# Patient Record
Sex: Female | Born: 1945 | Race: White | Hispanic: No | Marital: Single | Smoking: Former smoker
Health system: Southern US, Community
[De-identification: ages and names within clinical notes are randomized; demographics above are authoritative.]

## PROBLEM LIST (undated history)

## (undated) DIAGNOSIS — K219 Gastro-esophageal reflux disease without esophagitis: Secondary | ICD-10-CM

## (undated) DIAGNOSIS — M8580 Other specified disorders of bone density and structure, unspecified site: Secondary | ICD-10-CM

## (undated) DIAGNOSIS — K635 Polyp of colon: Secondary | ICD-10-CM

## (undated) DIAGNOSIS — E785 Hyperlipidemia, unspecified: Secondary | ICD-10-CM

## (undated) DIAGNOSIS — C801 Malignant (primary) neoplasm, unspecified: Secondary | ICD-10-CM

## (undated) DIAGNOSIS — T783XXA Angioneurotic edema, initial encounter: Secondary | ICD-10-CM

## (undated) DIAGNOSIS — G4730 Sleep apnea, unspecified: Secondary | ICD-10-CM

## (undated) DIAGNOSIS — L509 Urticaria, unspecified: Secondary | ICD-10-CM

## (undated) DIAGNOSIS — E039 Hypothyroidism, unspecified: Secondary | ICD-10-CM

## (undated) DIAGNOSIS — K5090 Crohn's disease, unspecified, without complications: Secondary | ICD-10-CM

## (undated) DIAGNOSIS — T7840XA Allergy, unspecified, initial encounter: Secondary | ICD-10-CM

## (undated) DIAGNOSIS — K529 Noninfective gastroenteritis and colitis, unspecified: Secondary | ICD-10-CM

## (undated) HISTORY — DX: Gastro-esophageal reflux disease without esophagitis: K21.9

## (undated) HISTORY — DX: Crohn's disease, unspecified, without complications: K50.90

## (undated) HISTORY — DX: Hyperlipidemia, unspecified: E78.5

## (undated) HISTORY — PX: BREAST BIOPSY: SHX20

## (undated) HISTORY — DX: Urticaria, unspecified: L50.9

## (undated) HISTORY — PX: TUBAL LIGATION: SHX77

## (undated) HISTORY — PX: SKIN CANCER EXCISION: SHX779

## (undated) HISTORY — DX: Hypothyroidism, unspecified: E03.9

## (undated) HISTORY — PX: TONSILLECTOMY: SUR1361

## (undated) HISTORY — DX: Malignant (primary) neoplasm, unspecified: C80.1

## (undated) HISTORY — DX: Noninfective gastroenteritis and colitis, unspecified: K52.9

## (undated) HISTORY — DX: Angioneurotic edema, initial encounter: T78.3XXA

## (undated) HISTORY — DX: Other specified disorders of bone density and structure, unspecified site: M85.80

## (undated) HISTORY — DX: Allergy, unspecified, initial encounter: T78.40XA

## (undated) HISTORY — DX: Polyp of colon: K63.5

## (undated) HISTORY — DX: Sleep apnea, unspecified: G47.30

---

## 1989-07-28 HISTORY — PX: THYROIDECTOMY, PARTIAL: SHX18

## 2016-05-27 ENCOUNTER — Encounter: Payer: Self-pay | Admitting: Gastroenterology

## 2016-07-31 ENCOUNTER — Encounter: Payer: Self-pay | Admitting: Gastroenterology

## 2016-07-31 ENCOUNTER — Encounter (INDEPENDENT_AMBULATORY_CARE_PROVIDER_SITE_OTHER): Payer: Self-pay

## 2016-07-31 ENCOUNTER — Ambulatory Visit (INDEPENDENT_AMBULATORY_CARE_PROVIDER_SITE_OTHER): Payer: Medicare HMO | Admitting: Gastroenterology

## 2016-07-31 ENCOUNTER — Telehealth: Payer: Self-pay | Admitting: Gastroenterology

## 2016-07-31 VITALS — BP 100/64 | HR 60 | Ht 68.0 in | Wt 131.6 lb

## 2016-07-31 DIAGNOSIS — Z1211 Encounter for screening for malignant neoplasm of colon: Secondary | ICD-10-CM

## 2016-07-31 DIAGNOSIS — K219 Gastro-esophageal reflux disease without esophagitis: Secondary | ICD-10-CM | POA: Diagnosis not present

## 2016-07-31 DIAGNOSIS — K649 Unspecified hemorrhoids: Secondary | ICD-10-CM

## 2016-07-31 DIAGNOSIS — K529 Noninfective gastroenteritis and colitis, unspecified: Secondary | ICD-10-CM | POA: Diagnosis not present

## 2016-07-31 MED ORDER — FAMOTIDINE 40 MG PO TABS: 40.0000 mg | ORAL_TABLET | Freq: Two times a day (BID) | ORAL | 3 refills | Status: DC

## 2016-07-31 MED ORDER — NA SULFATE-K SULFATE-MG SULF 17.5-3.13-1.6 GM/177ML PO SOLN: 1.0000 | Freq: Once | ORAL | 0 refills | Status: AC

## 2016-07-31 NOTE — Patient Instructions (Addendum)
If you are age 70 or older, your body mass index should be between 23-30. Your Body mass index is 20.01 kg/m. If this is out of the aforementioned range listed, please consider follow up with your Primary Care Provider.  If you are age 72 or younger, your body mass index should be between 19-25. Your Body mass index is 20.01 kg/m. If this is out of the aformentioned range listed, please consider follow up with your Primary Care Provider.   We have sent the following medications to your pharmacy for you to pick up at your convenience:  [place] have been scheduled for a colonoscopy. Please follow written instructions given to you at your visit today.  Please pick up your prep supplies at the pharmacy within the next 1-3 days. If you use inhalers (even only as needed), please bring them with you on the day of your procedure. Your physician has requested that you go to www.startemmi.com and enter the access code given to you at your visit today. This web site gives a general overview about your procedure. However, you should still follow specific instructions given to you by our office regarding your preparation for the procedure.  Thank you.

## 2016-07-31 NOTE — Telephone Encounter (Signed)
Record release faxed to Dr Virgie Dad.

## 2016-07-31 NOTE — Progress Notes (Signed)
HPI :  71 y/o female with a history of colon polyps, GERD, hypothyroidism, here for a new patient evaluation for diarrhea, hemorrhoids, and colon cancer screening, as well as reflux.  She reports intermittent symptoms of loose stools, has not had any symptoms in 4 weeks. She reports always had a "sensitive" stomach. She reports on a good day she can have one BM per day, sometimes every other day, and sometimes more frequently upwards of 3 loose stools per day. She thinks certain foods can sometimes precipitate symptoms. She denies any blood in the stools;, but has had it in the past. She reports some symptoms of hemorrhoids which bother her. She reports they prolapse, chronically. She does not think they bother her in regards to pain, itching, or bleeding. She has occasional lower abdominal pains. She reports this is relieved with a bowel movement. She has managed this mostly with diet. She thinks she has been tested for celiac disease but is not positive.   She reports last colonoscopy was 10 years ago, she thinks she had some polyps removed. She had an upper endoscopy at that time as well. Procedures performed at a facility in [place], she does not know the name.   She denies much heartburn or reflux which bothers her on her regimen. If she does not take her medications, reflux symptoms bother her more frequently. She denies any dysphagia. No history of Barrett's. No FH of esophageal or colon cancer. She takes omeprazole 80m daily, and pepsid 411mq HS. She has not yet tried pepcid BID.   Of note, a capsule endoscopy was done while at [facility] in  Sept 2013 showing isolated ileojejunitis with erosive changes, started on pentasa at that time. She thinks she took it for a short time and then stopped it. She has never been told she has Crohns disease definitively. She had a negative IBD serology. She does think she has a prior CT scan of the abdomen to assess for Crohns and told it was negative.      Past Medical History:  Diagnosis Date  . Colon polyp   . GERD (gastroesophageal reflux disease)   . Hypothyroidism      Past Surgical History:  Procedure Laterality Date  . ABDOMINAL HYSTERECTOMY    . THYROIDECTOMY, PARTIAL  1991   Family History  Problem Relation Age of Onset  . Diabetes Mother   . Heart disease Mother   . Ovarian cancer Sister   . Kidney disease Sister   . Diabetes Brother   . Heart disease Brother    Social History  Substance Use Topics  . Smoking status: Former SmResearch scientist (life sciences). Smokeless tobacco: Never Used  . Alcohol use No   Current Outpatient Prescriptions  Medication Sig Dispense Refill  . escitalopram (LEXAPRO) 10 MG tablet Take 1 tablet by mouth daily.    . famotidine (PEPCID) 40 MG tablet Take 1 tablet by mouth daily.    . Marland Kitchenevothyroxine (SYNTHROID, LEVOTHROID) 112 MCG tablet Take 1 tablet by mouth daily.    . Marland Kitchenmeprazole (PRILOSEC) 20 MG capsule Take 1 capsule by mouth daily.     No current facility-administered medications for this visit.    No Known Allergies   Review of Systems: All systems reviewed and negative except where noted in HPI.   No labs or imaging in Epic  Physical Exam: BP 100/64   Pulse 60   Ht 5' 8"  (1.727 m)   Wt 131 lb 9.6 oz (59.7 kg)  BMI 20.01 kg/m  Constitutional: Pleasant,well-developed, female in no acute distress. HEENT: Normocephalic and atraumatic. Conjunctivae are normal. No scleral icterus. Neck supple.  Cardiovascular: Normal rate, regular rhythm.  Pulmonary/chest: Effort normal and breath sounds normal. No wheezing, rales or rhonchi. Abdominal: Soft, nondistended, nontender.  There are no masses palpable. No hepatomegaly. Anoscopy / DRE - external skin tags, small to moderate internal hemorrhoids, normal tone, no mass lesions Extremities: no edema Lymphadenopathy: No cervical adenopathy noted. Neurological: Alert and oriented to person place and time. Skin: Skin is warm and dry. No rashes  noted. Psychiatric: Normal mood and affect. Behavior is normal.   ASSESSMENT AND PLAN: 71 year old female here for new patient evaluation for the following issues:  Chronic diarrhea - suspect she may have IBS although I reviewed prior report of her capsule endoscopy which showed changes concerning for possible small bowel Crohn's disease. She apparently had a follow-up workup after this finding for which she was told she does not have Crohn's. I have asked to obtain records from her prior GI physicians to clarify this history. I will also obtain prior lab work ensure she has been ruled out for celiac disease obtain baseline CBC and other labs from her primary care. Otherwise she is due for a screening colonoscopy, and we will schedule her for this. We may plan on taking random biopsies to rule out microscopic colitis. In the interim I counseled her on a low FODMAP diet and she can proceed with this to see if this helps.   Colon cancer screening - as above will await colonoscopy  GERD - symptoms well controlled on regimen as above. I discussed long-term risks of PPIs with her at length. Recommend lowest-dose dose of PPI needed to control symptoms or use of alternative regimen. In this light she wants to try managing this with Pepcid monotherapy which is reasonable. Recommend she try Pepcid twice a day and see how this goes. We'll get results of prior upper endoscopy, feel strongly that she needs endoscopy at this time, no reported history of Barrett's.   Hemorrhoids / skin tags - external skin tags noted on rectal exam is her main complaint, and she does have small to medium sized internal hemorrhoids. I discussed potential banding for her although if her main concern is the external skin tags, banding may not provide much benefit for this. We will await the colonoscopy, pending this result will let her know if I think she is a good candidate for banding or not. She agreed  Gordon Cellar,  MD [facility] Pager [phone]

## 2016-08-19 ENCOUNTER — Telehealth: Payer: Self-pay | Admitting: Gastroenterology

## 2016-08-19 NOTE — Telephone Encounter (Signed)
Records received for this patient from [facility] - Dr. 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 Treated with Pentasa at the time. Also received a course of Enterocort  NO further records after capsule study. 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 Scheduled for colonoscopy in the near future for which she will have re-evaluation, plan on ileal intubation, may need repeat enterography study.   Labs from 10/31 at [place] [facility] show no anemia, Hgb of 12.4

## 2016-09-02 ENCOUNTER — Encounter: Payer: Self-pay | Admitting: Gastroenterology

## 2016-09-16 ENCOUNTER — Encounter: Payer: Self-pay | Admitting: Gastroenterology

## 2016-09-16 ENCOUNTER — Ambulatory Visit (AMBULATORY_SURGERY_CENTER): Payer: Medicare HMO | Admitting: Gastroenterology

## 2016-09-16 VITALS — BP 110/65 | HR 56 | Temp 96.8°F | Resp 15 | Ht 68.0 in | Wt 131.0 lb

## 2016-09-16 DIAGNOSIS — K529 Noninfective gastroenteritis and colitis, unspecified: Secondary | ICD-10-CM

## 2016-09-16 MED ORDER — SODIUM CHLORIDE 0.9 % IV SOLN: 500.0000 mL | INTRAVENOUS | Status: DC

## 2016-09-16 NOTE — Progress Notes (Signed)
Patient c/o eyes feeling irritated upon awakening from anesthesia in recovery room. Right eye is pink. Patient has issues with dry eyes and uses eye drops at home. Anesthesia notified and has been here to assess patient.

## 2016-09-16 NOTE — Op Note (Signed)
[facility] Patient Name: Natalie Carroll Procedure Date: 09/16/2016 8:29 AM MRN: [id number] Endoscopist: Remo Lipps P. Tieisha Darden MD, MD Age: 71 Referring MD:  Date of Birth: 01/11/1946 Gender: Female Account #: [id number] Procedure:                Colonoscopy Indications:              Chronic diarrhea, prior workup raises possibility                            for small bowel Crohn's disease Medicines:                Monitored Anesthesia Care Procedure:                Pre-Anesthesia Assessment:                           - Prior to the procedure, a History and Physical                            was performed, and patient medications and                            allergies were reviewed. The patient's tolerance of                            previous anesthesia was also reviewed. The risks                            and benefits of the procedure and the sedation                            options and risks were discussed with the patient.                            All questions were answered, and informed consent                            was obtained. Prior Anticoagulants: The patient has                            taken no previous anticoagulant or antiplatelet                            agents. ASA Grade Assessment: II - A patient with                            mild systemic disease. After reviewing the risks                            and benefits, the patient was deemed in                            satisfactory condition to undergo the procedure.  After obtaining informed consent, the colonoscope                            was passed under direct vision. Throughout the                            procedure, the patient's blood pressure, pulse, and                            oxygen saturations were monitored continuously. The                            Model PCF-H190DL [id number]) scope was introduced                            through the anus and  advanced to the the terminal                            ileum, with identification of the appendiceal                            orifice and IC valve. The colonoscopy was performed                            without difficulty. The patient tolerated the                            procedure well. The quality of the bowel                            preparation was good. The terminal ileum, ileocecal                            valve, appendiceal orifice, and rectum were                            photographed. Scope In: 8:35:18 AM Scope Out: 8:55:54 AM Scope Withdrawal Time: 0 hours 16 minutes 9 seconds  Total Procedure Duration: 0 hours 20 minutes 36 seconds  Findings:                 The perianal and digital rectal examinations were                            normal.                           The mucosa in the terminal ileum was mildly                            erythematous without focal ulceration or erosion.                            Biopsies were taken with a cold forceps for  histology.                           A few scattered small-mouthed diverticula were                            found in the entire colon.                           Two small angiodysplastic lesions were found in the                            sigmoid colon and in the cecum.                           The colon appeared normal otherwise without                            inflammatory changes. Biopsies for histology were                            taken with a cold forceps from the right colon,                            left colon and transverse colon for evaluation of                            microscopic colitis. No polyps. Complications:            No immediate complications. Estimated blood loss:                            Minimal. Estimated Blood Loss:     Estimated blood loss was minimal. Impression:               - Erythematous mucosa in the terminal ileum.                             Biopsied.                           - Diverticulosis in the entire examined colon.                           - Two colonic angiodysplastic lesions.                           - The entire examined colon is normal. Biopsied to                            rule out microscopic colitis. Recommendation:           - Patient has a contact number available for                            emergencies. The signs and symptoms of potential  delayed complications were discussed with the                            patient. Return to normal activities tomorrow.                            Written discharge instructions were provided to the                            patient.                           - Resume previous diet.                           - Continue present medications.                           - Await pathology results. Remo Lipps P. Mckayla Mulcahey MD, MD 09/16/2016 9:02:38 AM This report has been signed electronically.

## 2016-09-16 NOTE — Progress Notes (Signed)
Called to room to assist during endoscopic procedure.  Patient ID and intended procedure confirmed with present staff. Received instructions for my participation in the procedure from the performing physician.  

## 2016-09-16 NOTE — Patient Instructions (Signed)
YOU HAD AN ENDOSCOPIC PROCEDURE TODAY AT [facility]:   Refer to the procedure report that was given to you for any specific questions about what was found during the examination.  If the procedure report does not answer your questions, please call your gastroenterologist to clarify.  If you requested that your care partner not be given the details of your procedure findings, then the procedure report has been included in a sealed envelope for you to review at your convenience later.  YOU SHOULD EXPECT: Some feelings of bloating in the abdomen. Passage of more gas than usual.  Walking can help get rid of the air that was put into your GI tract during the procedure and reduce the bloating. If you had a lower endoscopy (such as a colonoscopy or flexible sigmoidoscopy) you may notice spotting of blood in your stool or on the toilet paper. If you underwent a bowel prep for your procedure, you may not have a normal bowel movement for a few days.  Please Note:  You might notice some irritation and congestion in your nose or some drainage.  This is from the oxygen used during your procedure.  There is no need for concern and it should clear up in a day or so.  SYMPTOMS TO REPORT IMMEDIATELY:   Following lower endoscopy (colonoscopy or flexible sigmoidoscopy):  Excessive amounts of blood in the stool  Significant tenderness or worsening of abdominal pains  Swelling of the abdomen that is new, acute  Fever of 100F or higher  For urgent or emergent issues, a gastroenterologist can be reached at any hour by calling [phone].   DIET:  We do recommend a small meal at first, but then you may proceed to your regular diet.  Drink plenty of fluids but you should avoid alcoholic beverages for 24 hours.  MEDICATIONS:  Continue present medications.  Please read all hand-outs given to you by your recovery nurse.  ACTIVITY:  You should plan to take it easy for the rest of today and you  should NOT DRIVE or use heavy machinery until tomorrow (because of the sedation medicines used during the test).    FOLLOW UP: Our staff will call the number listed on your records the next business day following your procedure to check on you and address any questions or concerns that you may have regarding the information given to you following your procedure. If we do not reach you, we will leave a message.  However, if you are feeling well and you are not experiencing any problems, there is no need to return our call.  We will assume that you have returned to your regular daily activities without incident.  If any biopsies were taken you will be contacted by phone or by letter within the next 1-3 weeks.  Please call us at [phone] if you have not heard about the biopsies in 3 weeks.   Thank you for allowing [place] to provide for your healthcare needs today.   SIGNATURES/CONFIDENTIALITY: You and/or your care partner have signed paperwork which will be entered into your electronic medical record.  These signatures attest to the fact that that the information above on your After Visit Summary has been reviewed and is understood.  Full responsibility of the confidentiality of this discharge information lies with you and/or your care-partner.

## 2016-09-16 NOTE — Progress Notes (Signed)
Upon awakening fully from anesthesia, pt c/o R eye discomfort, described as "scratchy".  R eye appears slightly red with no obvious injury.  Pt denies blurry vision or pain with EOM movement. Pt reports history of dry eyes, particularly in the morning after sleeping all night. Pt counseled to use OTC eye drops today and tonight before going to bed.  If not resolved in 3 days, pt instructed to call office.

## 2016-09-16 NOTE — Progress Notes (Signed)
Spontaneous respirations throughout. VSS. Resting comfortably. To PACU on room air. Report to  [employer].

## 2016-09-17 ENCOUNTER — Telehealth: Payer: Self-pay

## 2016-09-17 NOTE — Telephone Encounter (Signed)
The reason for this exam was for chronic diarrhea. I am awaiting biopsies that were done for her exam. If she has not tried immodium she can use some of that as needed and I will contact her with the results once available. Thanks

## 2016-09-17 NOTE — Telephone Encounter (Signed)
Spoke to patient she states that after she hung up from talking to endo nurse, she started having diarrhea. She has not tried any imodium, she is drinking ginger ale. Please advise.

## 2016-09-17 NOTE — Telephone Encounter (Signed)
Let patient know to try the imodium as needed for the diarrhea and once the test results come back we will be in touch with her.

## 2016-09-17 NOTE — Telephone Encounter (Signed)
  Follow up Call-  Call back number 09/16/2016  Post procedure Call Back phone  # [phone]  Permission to leave phone message Yes     Patient questions:  Do you have a fever, pain , or abdominal swelling? No. Pain Score  0 *  Have you tolerated food without any problems? Yes.    Have you been able to return to your normal activities? Yes.    Do you have any questions about your discharge instructions: Diet   No. Medications  No. Follow up visit  No.  Do you have questions or concerns about your Care? No.  Actions: * If pain score is 4 or above: No action needed, pain <4.

## 2016-09-19 ENCOUNTER — Other Ambulatory Visit: Payer: Self-pay

## 2016-09-19 MED ORDER — OMEPRAZOLE 20 MG PO CPDR: 20.0000 mg | DELAYED_RELEASE_CAPSULE | Freq: Every day | ORAL | 3 refills | Status: DC

## 2016-12-10 ENCOUNTER — Other Ambulatory Visit: Payer: Self-pay | Admitting: Family Medicine

## 2016-12-10 ENCOUNTER — Ambulatory Visit
Admission: RE | Admit: 2016-12-10 | Discharge: 2016-12-10 | Disposition: A | Discharge status: Home / Self Care | Payer: Medicare HMO | Source: Ambulatory Visit | Attending: Family Medicine | Admitting: Family Medicine

## 2016-12-10 DIAGNOSIS — Z1231 Encounter for screening mammogram for malignant neoplasm of breast: Secondary | ICD-10-CM

## 2017-11-16 ENCOUNTER — Other Ambulatory Visit: Payer: Self-pay | Admitting: Obstetrics and Gynecology

## 2017-11-16 DIAGNOSIS — Z139 Encounter for screening, unspecified: Secondary | ICD-10-CM

## 2017-12-15 ENCOUNTER — Ambulatory Visit
Admission: RE | Admit: 2017-12-15 | Discharge: 2017-12-15 | Disposition: A | Discharge status: Home / Self Care | Payer: Medicare HMO | Source: Ambulatory Visit | Attending: Obstetrics and Gynecology | Admitting: Obstetrics and Gynecology

## 2017-12-15 DIAGNOSIS — Z139 Encounter for screening, unspecified: Secondary | ICD-10-CM

## 2017-12-16 ENCOUNTER — Other Ambulatory Visit: Payer: Self-pay | Admitting: Obstetrics and Gynecology

## 2017-12-16 DIAGNOSIS — R928 Other abnormal and inconclusive findings on diagnostic imaging of breast: Secondary | ICD-10-CM

## 2017-12-18 ENCOUNTER — Ambulatory Visit
Admission: RE | Admit: 2017-12-18 | Discharge: 2017-12-18 | Disposition: A | Discharge status: Home / Self Care | Payer: Medicare HMO | Source: Ambulatory Visit | Attending: Obstetrics and Gynecology | Admitting: Obstetrics and Gynecology

## 2017-12-18 ENCOUNTER — Ambulatory Visit: Payer: Medicare HMO

## 2017-12-18 DIAGNOSIS — R928 Other abnormal and inconclusive findings on diagnostic imaging of breast: Secondary | ICD-10-CM

## 2018-06-08 ENCOUNTER — Telehealth: Payer: Self-pay

## 2018-06-08 NOTE — Telephone Encounter (Signed)
SENT REFERRAL TO SCHEDULING AND FILED NOTES 

## 2018-07-06 ENCOUNTER — Ambulatory Visit: Payer: Medicare HMO | Admitting: Internal Medicine

## 2018-07-15 ENCOUNTER — Other Ambulatory Visit: Payer: Self-pay | Admitting: Obstetrics and Gynecology

## 2018-07-15 DIAGNOSIS — N6489 Other specified disorders of breast: Secondary | ICD-10-CM

## 2018-07-23 ENCOUNTER — Other Ambulatory Visit: Payer: Medicare HMO

## 2018-07-26 ENCOUNTER — Ambulatory Visit
Admission: RE | Admit: 2018-07-26 | Discharge: 2018-07-26 | Disposition: A | Discharge status: Home / Self Care | Payer: Medicare HMO | Source: Ambulatory Visit | Attending: Obstetrics and Gynecology | Admitting: Obstetrics and Gynecology

## 2018-07-26 ENCOUNTER — Other Ambulatory Visit: Payer: Medicare HMO

## 2018-07-26 DIAGNOSIS — N6489 Other specified disorders of breast: Secondary | ICD-10-CM

## 2018-07-27 ENCOUNTER — Other Ambulatory Visit: Payer: Medicare HMO

## 2018-08-05 ENCOUNTER — Ambulatory Visit: Payer: Medicare HMO | Admitting: Cardiology

## 2018-08-26 ENCOUNTER — Ambulatory Visit: Payer: Medicare HMO | Admitting: Cardiology

## 2019-02-01 ENCOUNTER — Other Ambulatory Visit (HOSPITAL_BASED_OUTPATIENT_CLINIC_OR_DEPARTMENT_OTHER): Payer: Self-pay | Admitting: Family Medicine

## 2019-02-01 ENCOUNTER — Other Ambulatory Visit: Payer: Self-pay

## 2019-02-01 ENCOUNTER — Ambulatory Visit (HOSPITAL_BASED_OUTPATIENT_CLINIC_OR_DEPARTMENT_OTHER)
Admission: RE | Admit: 2019-02-01 | Discharge: 2019-02-01 | Disposition: A | Discharge status: Home / Self Care | Payer: Medicare HMO | Source: Ambulatory Visit | Attending: Family Medicine | Admitting: Family Medicine

## 2019-02-01 DIAGNOSIS — M542 Cervicalgia: Secondary | ICD-10-CM | POA: Diagnosis present

## 2019-02-04 ENCOUNTER — Encounter: Payer: Self-pay | Admitting: Physical Therapy

## 2019-02-04 ENCOUNTER — Other Ambulatory Visit: Payer: Self-pay

## 2019-02-04 ENCOUNTER — Ambulatory Visit: Payer: Medicare HMO | Attending: Family Medicine | Admitting: Physical Therapy

## 2019-02-04 DIAGNOSIS — M542 Cervicalgia: Secondary | ICD-10-CM | POA: Diagnosis not present

## 2019-02-04 DIAGNOSIS — M62838 Other muscle spasm: Secondary | ICD-10-CM

## 2019-02-04 NOTE — Therapy (Signed)
[place], [place], [address] Phone: [phone]   Fax:  [phone]  Physical Therapy Evaluation  Patient Details  Name: Natalie Carroll MRN: [id number] Date of Birth: [birth year] Referring Provider (PT): Briscoe   Encounter Date: 02/04/2019  PT End of Session - 02/04/19 0910    Visit Number  1    Date for PT Re-Evaluation  04/07/19    PT Start Time  0488    PT Stop Time  0936    PT Time Calculation (min)  52 min    Activity Tolerance  Patient tolerated treatment well    Behavior During Therapy  Anxious       Past Medical History:  Diagnosis Date  . Colon polyp   . GERD (gastroesophageal reflux disease)   . Hypothyroidism     Past Surgical History:  Procedure Laterality Date  . BREAST BIOPSY     left breast  . THYROIDECTOMY, PARTIAL  1991  . TUBAL LIGATION      There were no vitals filed for this visit.   Subjective Assessment - 02/04/19 0846    Subjective  Patient reports that she has been having pain for about a year.  She is unsure of a cause, she had an x-ray that showed DDD with some osteophytic changes.  Reports that cool air and a fan will cause more pain    Limitations  Reading;House hold activities    Patient Stated Goals  have less pain    Currently in Pain?  Yes    Pain Score  4     Pain Location  Neck    Pain Orientation  Right    Pain Descriptors / Indicators  Aching;Spasm;Tightness    Pain Type  Acute pain    Pain Radiating Towards  some pain into the right upper trap    Pain Onset  More than a month ago    Pain Frequency  Constant    Aggravating Factors   cold air, fan, activity pain up to 7-8/10    Pain Relieving Factors  heat, gentle stretches 4/10    Effect of Pain on Daily Activities  reports just always hurts and is uncomfortable         [facility] PT Assessment - 02/04/19 0001      Assessment   Medical Diagnosis  cervicalgia, DDD    Referring  Provider (PT)  Briscoe    Onset Date/Surgical Date  01/05/19    Hand Dominance  Right    Prior Therapy  no      Precautions   Precautions  None      Balance Screen   Has the patient fallen in the past 6 months  No    Has the patient had a decrease in activity level because of a fear of falling?   No    Is the patient reluctant to leave their home because of a fear of falling?   No      Home Environment   Additional Comments  does housework and yardwork      Prior Function   Level of Independence  Independent    Vocation  Retired    Leisure  does a Oceanographer Comments  fwd shoulders, she reports that she really works on her posture as she wants to slouch      ROM / Strength   AROM /  PROM / Strength  AROM;Strength      AROM   Overall AROM Comments  shoulder ROM WNL's, Cervical ROM WNL's for flexion and extension, rotation decrease 25%, side bending decresaed 50%      Strength   Overall Strength Comments  4/5 some pain in the right upper trap with abduction      Palpation   Palpation comment  She has significant mm spasms in the right upper trap and the neck                Objective measurements completed on examination: See above findings.      [facility] Adult PT Treatment/Exercise - 02/04/19 0001      Modalities   Modalities  Electrical Stimulation;Moist Heat      Moist Heat Therapy   Number Minutes Moist Heat  15 Minutes    Moist Heat Location  Cervical      Electrical Stimulation   Electrical Stimulation Location  right cervical and upper trap area    Electrical Stimulation Action  IFC    Electrical Stimulation Parameters  supine    Electrical Stimulation Goals  Pain             PT Education - 02/04/19 0910    Education Details  HEP for cervcial and scapular retraction, shrugs, upper trap and levator stretches, gave info on home cervical traction    Person(s) Educated  Patient    Methods   Explanation;Demonstration;Tactile cues;Verbal cues;Handout    Comprehension  Verbalized understanding       PT Short Term Goals - 02/04/19 0913      PT SHORT TERM GOAL #1   Title  independent with initial HEP    Time  2    Period  Weeks    Status  New        PT Long Term Goals - 02/04/19 0913      PT LONG TERM GOAL #1   Title  understand posture and body mechanics    Time  8    Period  Weeks    Status  New      PT LONG TERM GOAL #2   Title  decrease pain 50%    Time  8    Period  Weeks    Status  New      PT LONG TERM GOAL #3   Title  increase cervical ROM 25%    Time  8    Period  Weeks    Status  New      PT LONG TERM GOAL #4   Title  report able to write without pain for 45 minutes    Time  8    Period  Weeks    Status  New             Plan - 02/04/19 0911    Clinical Impression Statement  Patient with neck pain for over a year, she is unsure of a cause, x-rays show DDD.  She does report doing a lot of writing.  She has significant spasms in the upper traps and the cervical parapsinals. She reports that she does try to correct her posture often while sitting but reports difficulty and pain.       Patient will benefit from skilled therapeutic intervention in order to improve the following deficits and impairments:  Decreased range of motion, Decreased strength, Increased muscle spasms, Postural dysfunction, Improper body mechanics, Pain  Visit Diagnosis: 1. Cervicalgia   2. Other muscle  spasm        Problem List Patient Active Problem List   Diagnosis Date Noted  . GERD (gastroesophageal reflux disease) 07/31/2016  . Hemorrhoids 07/31/2016    Sumner Boast, PT 02/04/2019, 9:16 AM  [facility] [place], [place], [address] Phone: [phone]   Fax:  [phone]  Name: Natalie Carroll MRN: [id number] Date of Birth: 16-Oct-1945

## 2019-02-11 ENCOUNTER — Other Ambulatory Visit: Payer: Self-pay

## 2019-02-11 ENCOUNTER — Ambulatory Visit: Payer: Medicare HMO | Admitting: Physical Therapy

## 2019-02-11 ENCOUNTER — Encounter: Payer: Self-pay | Admitting: Physical Therapy

## 2019-02-11 DIAGNOSIS — M542 Cervicalgia: Secondary | ICD-10-CM

## 2019-02-11 DIAGNOSIS — M62838 Other muscle spasm: Secondary | ICD-10-CM

## 2019-02-11 NOTE — Therapy (Signed)
[place], [place], [address] Phone: [phone]   Fax:  [phone]  Physical Therapy Treatment  Patient Details  Name: Natalie Carroll MRN: [id number] Date of Birth: September 15, 1945 Referring Provider (PT): Briscoe   Encounter Date: 02/11/2019  PT End of Session - 02/11/19 0904    Visit Number  2    Date for PT Re-Evaluation  04/07/19    PT Start Time  0838    PT Stop Time  0935    PT Time Calculation (min)  57 min    Activity Tolerance  Patient tolerated treatment well    Behavior During Therapy  [facility] for tasks assessed/performed       Past Medical History:  Diagnosis Date  . Colon polyp   . GERD (gastroesophageal reflux disease)   . Hypothyroidism     Past Surgical History:  Procedure Laterality Date  . BREAST BIOPSY     left breast  . THYROIDECTOMY, PARTIAL  1991  . TUBAL LIGATION      There were no vitals filed for this visit.  Subjective Assessment - 02/11/19 0841    Subjective  Patient reports that she is doing well, felt better after the first treatment, she still c/o tightness    Currently in Pain?  Yes    Pain Score  3     Pain Location  Neck    Pain Orientation  Right                       [facility] Adult PT Treatment/Exercise - 02/11/19 0001      Therapeutic Activites    Therapeutic Activities  ADL's    ADL's  reviewed posture and body mechanics, talked about a postural ergonomic reminder and then went over the home cervical traction unit and how it could help      Exercises   Exercises  Neck      Neck Exercises: Theraband   Shoulder Extension  20 reps;Red    Rows  20 reps;Red    Shoulder External Rotation  20 reps;Red      Neck Exercises: Standing   Other Standing Exercises  5# shrugs with upper trap and levator stretches      Modalities   Modalities  Traction      Moist Heat Therapy   Number Minutes Moist Heat  15 Minutes    Moist Heat  Location  Cervical      Electrical Stimulation   Electrical Stimulation Location  right cervical and upper trap area    Electrical Stimulation Action  IFC    Electrical Stimulation Parameters  supine    Electrical Stimulation Goals  Pain      Traction   Type of Traction  Cervical    Min (lbs)  12    Hold Time  static    Time  12             PT Education - 02/11/19 0903    Education Details  gave red tband HEP for scapular rows and extension and ER, info on cervical retraction at home    Person(s) Educated  Patient    Methods  Explanation;Demonstration;Tactile cues;Verbal cues;Handout    Comprehension  Verbalized understanding;Returned demonstration;Verbal cues required;Tactile cues required       PT Short Term Goals - 02/11/19 0907      PT SHORT TERM GOAL #1   Title  independent with initial HEP  Status  Achieved        PT Long Term Goals - 02/04/19 0913      PT LONG TERM GOAL #1   Title  understand posture and body mechanics    Time  8    Period  Weeks    Status  New      PT LONG TERM GOAL #2   Title  decrease pain 50%    Time  8    Period  Weeks    Status  New      PT LONG TERM GOAL #3   Title  increase cervical ROM 25%    Time  8    Period  Weeks    Status  New      PT LONG TERM GOAL #4   Title  report able to write without pain for 45 minutes    Time  8    Period  Weeks    Status  New            Plan - 02/11/19 0905    Clinical Impression Statement  Patient needed a lot of cues with scapular retraction, she would do very well for about 5 reps and then lose it and only did arm motions.  She had a lot of questions about HEP and home traction.  She needs cues about posture as she is on the computer at home    PT Next Visit Plan  see if we can add some gym activities, she does have anxiety about the corona virus    Consulted and Agree with Plan of Care  Patient       Patient will benefit from skilled therapeutic intervention in order to  improve the following deficits and impairments:  Decreased range of motion, Decreased strength, Increased muscle spasms, Postural dysfunction, Improper body mechanics, Pain  Visit Diagnosis: 1. Cervicalgia   2. Other muscle spasm        Problem List Patient Active Problem List   Diagnosis Date Noted  . GERD (gastroesophageal reflux disease) 07/31/2016  . Hemorrhoids 07/31/2016    Sumner Boast., PT 02/11/2019, 9:08 AM  [facility] [place], [place], [address] Phone: [phone]   Fax:  [phone]  Name: Natalie Carroll MRN: [id number] Date of Birth: [birth year]

## 2019-02-17 ENCOUNTER — Encounter: Payer: Self-pay | Admitting: Physical Therapy

## 2019-02-17 ENCOUNTER — Ambulatory Visit: Payer: Medicare HMO | Admitting: Physical Therapy

## 2019-02-17 ENCOUNTER — Other Ambulatory Visit: Payer: Self-pay

## 2019-02-17 DIAGNOSIS — M62838 Other muscle spasm: Secondary | ICD-10-CM

## 2019-02-17 DIAGNOSIS — M542 Cervicalgia: Secondary | ICD-10-CM | POA: Diagnosis not present

## 2019-02-17 NOTE — Therapy (Signed)
[place], [place], [address] Phone: [phone]   Fax:  [phone]  Physical Therapy Treatment  Patient Details  Name: Natalie Carroll MRN: [id number] Date of Birth: 02-03-1946 Referring Provider (PT): Briscoe   Encounter Date: 02/17/2019  PT End of Session - 02/17/19 1051    Visit Number  3    Date for PT Re-Evaluation  04/07/19    PT Start Time  1014    PT Stop Time  1104    PT Time Calculation (min)  50 min    Activity Tolerance  Patient tolerated treatment well    Behavior During Therapy  [facility] for tasks assessed/performed       Past Medical History:  Diagnosis Date  . Colon polyp   . GERD (gastroesophageal reflux disease)   . Hypothyroidism     Past Surgical History:  Procedure Laterality Date  . BREAST BIOPSY     left breast  . THYROIDECTOMY, PARTIAL  1991  . TUBAL LIGATION      There were no vitals filed for this visit.  Subjective Assessment - 02/17/19 1044    Subjective  "this really helps, I am concerned about the copay"    Currently in Pain?  Yes    Pain Score  2     Pain Location  Neck    Pain Relieving Factors  treatment                       [facility] Adult PT Treatment/Exercise - 02/17/19 0001      Neck Exercises: Theraband   Shoulder Extension  20 reps;Green    Rows  20 reps;Green    Shoulder External Rotation  20 reps;Red      Neck Exercises: Standing   Other Standing Exercises  5# shrugs with upper trap and levator stretches      Neck Exercises: Seated   Other Seated Exercise  bent over row 3#, extension and reverse flies 2# 2x10 each      Moist Heat Therapy   Number Minutes Moist Heat  15 Minutes    Moist Heat Location  Cervical      Electrical Stimulation   Electrical Stimulation Location  right cervical and upper trap area    Electrical Stimulation Action  IFC    Electrical Stimulation Parameters  supine    Electrical  Stimulation Goals  Pain      Manual Therapy   Manual Therapy  Soft tissue mobilization;Manual Traction    Soft tissue mobilization  to the right upper trap, cervical area    Manual Traction  occipital release, shoulder depression               PT Short Term Goals - 02/11/19 0907      PT SHORT TERM GOAL #1   Title  independent with initial HEP    Status  Achieved        PT Long Term Goals - 02/17/19 1053      PT LONG TERM GOAL #1   Title  understand posture and body mechanics    Status  Partially Met      PT LONG TERM GOAL #2   Title  decrease pain 50%    Status  Partially Met            Plan - 02/17/19 1052    Clinical Impression Statement  Patient did well with exercises today, still requires cues  for the retractrion.  She is very tight in the upper trap, the levator and the cervical area.  She does have concerns about her copay so she may skip a week    PT Next Visit Plan  may skip a week if needed due to copay    Consulted and Agree with Plan of Care  Patient       Patient will benefit from skilled therapeutic intervention in order to improve the following deficits and impairments:  Decreased range of motion, Decreased strength, Increased muscle spasms, Postural dysfunction, Improper body mechanics, Pain  Visit Diagnosis: 1. Cervicalgia   2. Other muscle spasm        Problem List Patient Active Problem List   Diagnosis Date Noted  . GERD (gastroesophageal reflux disease) 07/31/2016  . Hemorrhoids 07/31/2016    Sumner Boast., PT 02/17/2019, 10:54 AM  [place], [place], [address] Phone: [phone]   Fax:  [phone]  Name: Natalie Carroll MRN: [id number] Date of Birth: Dec 15, 1945

## 2019-02-25 ENCOUNTER — Encounter: Payer: Self-pay | Admitting: Physical Therapy

## 2019-03-02 ENCOUNTER — Encounter: Payer: Self-pay | Admitting: Physical Therapy

## 2019-04-18 ENCOUNTER — Other Ambulatory Visit (INDEPENDENT_AMBULATORY_CARE_PROVIDER_SITE_OTHER): Payer: Medicare HMO

## 2019-04-18 ENCOUNTER — Ambulatory Visit: Payer: Medicare HMO | Admitting: Physician Assistant

## 2019-04-18 ENCOUNTER — Encounter: Payer: Self-pay | Admitting: Physician Assistant

## 2019-04-18 VITALS — BP 96/60 | HR 74 | Temp 98.0°F | Ht 68.0 in | Wt 133.0 lb

## 2019-04-18 DIAGNOSIS — R197 Diarrhea, unspecified: Secondary | ICD-10-CM

## 2019-04-18 LAB — CBC WITH DIFFERENTIAL/PLATELET
Basophils Absolute: 0 10*3/uL (ref 0.0–0.1)
Basophils Relative: 1 % (ref 0.0–3.0)
Eosinophils Absolute: 0.1 10*3/uL (ref 0.0–0.7)
Eosinophils Relative: 3.2 % (ref 0.0–5.0)
HCT: 39 % (ref 36.0–46.0)
Hemoglobin: 12.7 g/dL (ref 12.0–15.0)
Lymphocytes Relative: 18.5 % (ref 12.0–46.0)
Lymphs Abs: 0.8 10*3/uL (ref 0.7–4.0)
MCHC: 32.6 g/dL (ref 30.0–36.0)
MCV: 92.4 fl (ref 78.0–100.0)
Monocytes Absolute: 0.5 10*3/uL (ref 0.1–1.0)
Monocytes Relative: 10.5 % (ref 3.0–12.0)
Neutro Abs: 3 10*3/uL (ref 1.4–7.7)
Neutrophils Relative %: 66.8 % (ref 43.0–77.0)
Platelets: 200 10*3/uL (ref 150.0–400.0)
RBC: 4.22 Mil/uL (ref 3.87–5.11)
RDW: 12.9 % (ref 11.5–15.5)
WBC: 4.5 10*3/uL (ref 4.0–10.5)

## 2019-04-18 LAB — SEDIMENTATION RATE: Sed Rate: 34 mm/hr — ABNORMAL HIGH (ref 0–30)

## 2019-04-18 LAB — C-REACTIVE PROTEIN: CRP: <1 mg/dL (ref 0.5–20.0)

## 2019-04-18 LAB — IGA: IgA: 221 mg/dL (ref 68–378)

## 2019-04-18 NOTE — Progress Notes (Signed)
Agree with assessment and plan as outlined. In this situation, fecal calprotectin could be useful as well if covered by insurance.

## 2019-04-18 NOTE — Patient Instructions (Addendum)
If you are age 72 or older, your body mass index should be between 23-30. Your Body mass index is 20.22 kg/m. If this is out of the aforementioned range listed, please consider follow up with your Primary Care Provider.  If you are age 40 or younger, your body mass index should be between 19-25. Your Body mass index is 20.22 kg/m. If this is out of the aformentioned range listed, please consider follow up with your Primary Care Provider.   Try decreasing Pepcid to 20 mg twice daily if switching from omeprazole. Continue your daily probiotic.  Anti reflux diet given.  Your provider has requested that you go to the basement level for lab work before leaving today. Press "B" on the elevator. The lab is located at the first door on the left as you exit the elevator.  Food Choices for Gastroesophageal Reflux Disease, Adult When you have gastroesophageal reflux disease (GERD), the foods you eat and your eating habits are very important. Choosing the right foods can help ease your discomfort. Think about working with a nutrition specialist (dietitian) to help you make good choices. What are tips for following this plan?  Meals  Choose healthy foods that are low in fat, such as fruits, vegetables, whole grains, low-fat dairy products, and lean meat, fish, and poultry.  Eat small meals often instead of 3 large meals a day. Eat your meals slowly, and in a place where you are relaxed. Avoid bending over or lying down until 2-3 hours after eating.  Avoid eating meals 2-3 hours before bed.  Avoid drinking a lot of liquid with meals.  Cook foods using methods other than frying. Bake, grill, or broil food instead.  Avoid or limit: ? Chocolate. ? Peppermint or spearmint. ? Alcohol. ? Pepper. ? Black and decaffeinated coffee. ? Black and decaffeinated tea. ? Bubbly (carbonated) soft drinks. ? Caffeinated energy drinks and soft drinks.  Limit high-fat foods such as: ? Fatty meat or fried  foods. ? Whole milk, cream, butter, or ice cream. ? Nuts and nut butters. ? Pastries, donuts, and sweets made with butter or shortening.  Avoid foods that cause symptoms. These foods may be different for everyone. Common foods that cause symptoms include: ? Tomatoes. ? Oranges, lemons, and limes. ? Peppers. ? Spicy food. ? Onions and garlic. ? Vinegar. Lifestyle  Maintain a healthy weight. Ask your doctor what weight is healthy for you. If you need to lose weight, work with your doctor to do so safely.  Exercise for at least 30 minutes for 5 or more days each week, or as told by your doctor.  Wear loose-fitting clothes.  Do not smoke. If you need help quitting, ask your doctor.  Sleep with the head of your bed higher than your feet. Use a wedge under the mattress or blocks under the bed frame to raise the head of the bed. Summary  When you have gastroesophageal reflux disease (GERD), food and lifestyle choices are very important in easing your symptoms.  Eat small meals often instead of 3 large meals a day. Eat your meals slowly, and in a place where you are relaxed.  Limit high-fat foods such as fatty meat or fried foods.  Avoid bending over or lying down until 2-3 hours after eating.  Avoid peppermint and spearmint, caffeine, alcohol, and chocolate. This information is not intended to replace advice given to you by your health care provider. Make sure you discuss any questions you have with your health care  provider. Document Released: 01/13/2012 Document Revised: 11/04/2018 Document Reviewed: 08/19/2016 Elsevier Patient Education  2020 [employer].   Follow up with Dr Havery Moros as needed.  Thank you for choosing me and [facility] Amy Trellis Paganini, PA-C

## 2019-04-18 NOTE — Progress Notes (Signed)
Subjective:    Patient ID: Natalie Carroll, female    DOB: July 23, 1946, 73 y.o.   MRN: [id number]  HPI Natalie "Jeanett Schlein" is a pleasant 73 year old white female, established with Dr. Havery Moros who comes in today with complaints of intermittent diarrhea. She has history of chronic GERD, colon polyps, colonic AVMs, hypothyroidism. She was last seen in the office in January 2018.  She was started on a low FODMAP diet at that time and then underwent colonoscopy in February 2018 with finding of 2 small AVMs, the terminal ileum was noted to be mildly erythematous but without ulcers and colon was normal.  Biopsies were done to rule out microscopic colitis and these were negative.  There was some focal active inflammation on ileal biopsies, nonspecific.  Question of Crohn's was raised.  Capsule endoscopy was suggested but she decided not to follow through with that at that time.  She also had prior records from [facility] reviewed by Dr. Havery Moros likewise with previous questionable Crohn's.  She did have IBD serology done which was negative. Patient says since that time she has had some diarrhea intermittently.  She says it tends to come and go at times she will have some burning discomfort in the left lower quadrant.  More recently she had been having some episodes over the past couple of months with urgent diarrhea that was waking her up at about 3 AM, sometimes incontinent.  She would have one episode of diarrhea and then feel fine.  This was occurring every 1 to 2 weeks.  She started herself on a probiotic which she showed me today which is a multi-species product.  She says since she started that she has not had any further episodes of diarrhea and that was about 3 weeks ago.  Occasionally she will have some sense of urgency during the day but has generally been having normal bowel movements.  No melena or hematochezia.  No ongoing abdominal pain. She also has history of chronic GERD and  had questions about meds today.  Specifically she is asking about omeprazole and whether it is safe.  She has been on 20 mg daily long-term.  She also has a prescription for Pepcid 40 mg but says when she has tried Pepcid in the past this resulted in diarrhea and so she has avoided it.  Review of Systems Pertinent positive and negative review of systems were noted in the above HPI section.  All other review of systems was otherwise negative.  Outpatient Encounter Medications as of 04/18/2019  Medication Sig  . calcium-vitamin D 250-100 MG-UNIT tablet Take 1 tablet by mouth 2 (two) times daily.  . cholecalciferol (VITAMIN D3) 25 MCG (1000 UT) tablet Take 1,000 Units by mouth daily.  Marland Kitchen escitalopram (LEXAPRO) 10 MG tablet Take 1 tablet by mouth daily.  . famotidine (PEPCID) 40 MG tablet Take 1 tablet (40 mg total) by mouth 2 (two) times daily.  Marland Kitchen levothyroxine (SYNTHROID, LEVOTHROID) 112 MCG tablet Take 1 tablet by mouth daily.  . Magnesium Oxide 250 MG TABS Take by mouth daily.  . Misc Natural Products (JOINT HEALTH PO) Take by mouth. zyflamed by new chapter once daily  . Multiple Vitamin (MULTIVITAMIN) tablet Take 1 tablet by mouth daily.  Marland Kitchen omeprazole (PRILOSEC) 20 MG capsule Take 1 capsule (20 mg total) by mouth daily.  . pravastatin (PRAVACHOL) 20 MG tablet Take 20 mg by mouth daily.  . Probiotic Product (PROBIOTIC DAILY PO) Take by mouth.  . TURMERIC PO Take  720 mg by mouth daily.  . vitamin B-12 (CYANOCOBALAMIN) 1000 MCG tablet Take 1,000 mcg by mouth daily.  . vitamin C (ASCORBIC ACID) 500 MG tablet Take 500 mg by mouth daily.   Facility-Administered Encounter Medications as of 04/18/2019  Medication  . 0.9 %  sodium chloride infusion   No Known Allergies Patient Active Problem List   Diagnosis Date Noted  . GERD (gastroesophageal reflux disease) 07/31/2016  . Hemorrhoids 07/31/2016   Social History   Socioeconomic History  . Marital status: Single    Spouse name: Not on file   . Number of children: 1  . Years of education: Not on file  . Highest education level: Not on file  Occupational History  . Occupation: Retired  Scientific laboratory technician  . Financial resource strain: Not on file  . Food insecurity    Worry: Not on file    Inability: Not on file  . Transportation needs    Medical: Not on file    Non-medical: Not on file  Tobacco Use  . Smoking status: Former Research scientist (life sciences)  . Smokeless tobacco: Never Used  Substance and Sexual Activity  . Alcohol use: No  . Drug use: No  . Sexual activity: Not on file  Lifestyle  . Physical activity    Days per week: Not on file    Minutes per session: Not on file  . Stress: Not on file  Relationships  . Social Herbalist on phone: Not on file    Gets together: Not on file    Attends religious service: Not on file    Active member of club or organization: Not on file    Attends meetings of clubs or organizations: Not on file    Relationship status: Not on file  . Intimate partner violence    Fear of current or ex partner: Not on file    Emotionally abused: Not on file    Physically abused: Not on file    Forced sexual activity: Not on file  Other Topics Concern  . Not on file  Social History Narrative  . Not on file    Ms. Lofland's family history includes Diabetes in her brother and mother; Heart disease in her brother and mother; Kidney disease in her sister; Ovarian cancer in her sister.      Objective:    Vitals:   04/18/19 1009  BP: 96/60  Pulse: 74  Temp: 98 F (36.7 C)    Physical Exam Well-developed well-nourished white  female in no acute distress.  Height, VQQVZD,638 BMI 20.2  HEENT; nontraumatic normocephalic, EOMI, PER R LA, sclera anicteric. Oropharynx;mask/Covid Neck; supple, no JVD Cardiovascular; regular rate and rhythm with S1-S2, no murmur rub or gallop Pulmonary; Clear bilaterally Abdomen; soft, nontender, nondistended, no palpable mass or hepatosplenomegaly, bowel sounds are  active Rectal; not done Skin; benign exam, no jaundice rash or appreciable lesions Extremities; no clubbing cyanosis or edema skin warm and dry Neuro/Psych; alert and oriented x4, grossly nonfocal mood and affect appropriate       Assessment & Plan:   #39 73 year old white female with previous work-up for diarrhea in 2018 showing some nonspecific inflammation of the TI on biopsies, colonic biopsies negative for microscopic colitis.  Question of mild Crohn's entertained but patient has never been treated. Capsule endoscopy and trial of budesonide had been suggested, but patient has not been seen over the past couple of years. Patient comes back in today with complaints of intermittent episodes of  diarrhea over the past couple of months generally occurring nocturnally and sometimes with incontinence. She says prior to that over the past couple of years she would just have a sporadic episode of diarrhea but nothing on a regular basis.  Symptoms now improved over the past 3 weeks with addition of probiotic.  Etiology not clear, current symptoms more consistent with an IBS type picture with very intermittent nature.  We will also rule out celiac disease  #2 chronic GERD-on chronic low-dose PPI with good control #3 small colonic AVMs  Plan; continue daily probiotic, multi-species product. Sed rate, CRP, TTG and IgA. Patient was given a copy of an antireflux diet and antireflux regimen to try to tighten up both. If she wants to try to come off of omeprazole she will try taking 1/2 tablet of 40 mg Pepcid twice daily, which hopefully will have less tendency to cause diarrhea. Patient will follow-up with Dr. Havery Moros or myself.   S  PA-C 04/18/2019   Cc: Katherina Mires, MD

## 2019-04-19 ENCOUNTER — Emergency Department (HOSPITAL_BASED_OUTPATIENT_CLINIC_OR_DEPARTMENT_OTHER)
Admission: EM | Admit: 2019-04-19 | Discharge: 2019-04-19 | Disposition: A | Discharge status: Home / Self Care | Payer: Medicare HMO | Attending: Emergency Medicine | Admitting: Emergency Medicine

## 2019-04-19 ENCOUNTER — Encounter (HOSPITAL_BASED_OUTPATIENT_CLINIC_OR_DEPARTMENT_OTHER): Payer: Self-pay | Admitting: *Deleted

## 2019-04-19 ENCOUNTER — Other Ambulatory Visit: Payer: Self-pay

## 2019-04-19 ENCOUNTER — Emergency Department (HOSPITAL_BASED_OUTPATIENT_CLINIC_OR_DEPARTMENT_OTHER): Payer: Medicare HMO

## 2019-04-19 DIAGNOSIS — R079 Chest pain, unspecified: Secondary | ICD-10-CM | POA: Diagnosis present

## 2019-04-19 DIAGNOSIS — E039 Hypothyroidism, unspecified: Secondary | ICD-10-CM | POA: Insufficient documentation

## 2019-04-19 DIAGNOSIS — Z79899 Other long term (current) drug therapy: Secondary | ICD-10-CM | POA: Diagnosis not present

## 2019-04-19 DIAGNOSIS — R0789 Other chest pain: Secondary | ICD-10-CM

## 2019-04-19 DIAGNOSIS — Z87891 Personal history of nicotine dependence: Secondary | ICD-10-CM | POA: Diagnosis not present

## 2019-04-19 LAB — TISSUE TRANSGLUTAMINASE, IGA: (tTG) Ab, IgA: 1 U/mL

## 2019-04-19 LAB — CBC
HCT: 39.8 % (ref 36.0–46.0)
Hemoglobin: 12.7 g/dL (ref 12.0–15.0)
MCH: 30 pg (ref 26.0–34.0)
MCHC: 31.9 g/dL (ref 30.0–36.0)
MCV: 94.1 fL (ref 80.0–100.0)
Platelets: 200 10*3/uL (ref 150–400)
RBC: 4.23 MIL/uL (ref 3.87–5.11)
RDW: 12.6 % (ref 11.5–15.5)
WBC: 4.7 10*3/uL (ref 4.0–10.5)
nRBC: 0 % (ref 0.0–0.2)

## 2019-04-19 LAB — TROPONIN I (HIGH SENSITIVITY)
Troponin I (High Sensitivity): <2 ng/L (ref <18)
Troponin I (High Sensitivity): <2 ng/L (ref <18)

## 2019-04-19 LAB — BASIC METABOLIC PANEL
Anion gap: 11 (ref 5–15)
BUN: 22 mg/dL (ref 8–23)
CO2: 28 mmol/L (ref 22–32)
Calcium: 10.1 mg/dL (ref 8.9–10.3)
Chloride: 100 mmol/L (ref 98–111)
Creatinine, Ser: 0.85 mg/dL (ref 0.44–1.00)
GFR calc Af Amer: >60 mL/min (ref >60)
GFR calc non Af Amer: >60 mL/min (ref >60)
Glucose, Bld: 103 mg/dL — ABNORMAL HIGH (ref 70–99)
Potassium: 3.7 mmol/L (ref 3.5–5.1)
Sodium: 139 mmol/L (ref 135–145)

## 2019-04-19 MED ORDER — SODIUM CHLORIDE 0.9% FLUSH
3.0000 mL | Freq: Once | INTRAVENOUS | Status: AC
  Administered 2019-04-19: 3 mL via INTRAVENOUS
  Filled 2019-04-19: qty 3

## 2019-04-19 NOTE — Discharge Instructions (Addendum)
Return as we discussed for any new or worse chest pain or chest pain lasting 15 minutes or longer.  They can appointment to follow-up with cardiology.  Today's work-up without any acute findings.  Recommend that she start a baby aspirin a day.

## 2019-04-19 NOTE — ED Triage Notes (Signed)
Pressure in her chest x 2 days. She was seen by her MD today and had EKG changes. She was told to come here for further evaluation.

## 2019-04-19 NOTE — ED Provider Notes (Signed)
[facility] EMERGENCY DEPARTMENT Provider Note   CSN: [id number] Arrival date & time: 04/19/19  1407     History   Chief Complaint Chief Complaint  Patient presents with  . Chest Pain    HPI Natalie Carroll is a 73 y.o. female.     Patient sent in from primary care's office for EKG changes.  Patient is also had some intermittent chest pain today lasting only seconds kind of sharp in nature.  Has been going on for a few days.  Originally it was more left-sided chest now it is kind of down in the lower sternum area.  No abdominal pain.  No shortness of breath no nausea or vomiting.  The symptoms are new for her.  Nothing is lasted 15 or 20 minutes.  No known coronary artery disease no known history of diabetes known history no history of hypertension.  Patient's had a total of about 3-4 bouts of this today.  No symptoms currently     Past Medical History:  Diagnosis Date  . Colon polyp   . GERD (gastroesophageal reflux disease)   . Hypothyroidism     Patient Active Problem List   Diagnosis Date Noted  . GERD (gastroesophageal reflux disease) 07/31/2016  . Hemorrhoids 07/31/2016    Past Surgical History:  Procedure Laterality Date  . BREAST BIOPSY     left breast  . THYROIDECTOMY, PARTIAL  1991  . TUBAL LIGATION       OB History   No obstetric history on file.      Home Medications    Prior to Admission medications   Medication Sig Start Date End Date Taking? Authorizing Provider  calcium-vitamin D 250-100 MG-UNIT tablet Take 1 tablet by mouth 2 (two) times daily.    [provider]  cholecalciferol (VITAMIN D3) 25 MCG (1000 UT) tablet Take 1,000 Units by mouth daily.    [provider]  escitalopram (LEXAPRO) 10 MG tablet Take 1 tablet by mouth daily. 06/06/16   [provider]  famotidine (PEPCID) 40 MG tablet Take 1 tablet (40 mg total) by mouth 2 (two) times daily. 07/31/16   Armbruster, Carlota Raspberry, MD  levothyroxine  (SYNTHROID, LEVOTHROID) 112 MCG tablet Take 1 tablet by mouth daily.    [provider]  Magnesium Oxide 250 MG TABS Take by mouth daily.    [provider]  Misc Natural Products (JOINT HEALTH PO) Take by mouth. zyflamed by new chapter once daily    [provider]  Multiple Vitamin (MULTIVITAMIN) tablet Take 1 tablet by mouth daily.    [provider]  omeprazole (PRILOSEC) 20 MG capsule Take 1 capsule (20 mg total) by mouth daily. 09/19/16   Armbruster, Carlota Raspberry, MD  pravastatin (PRAVACHOL) 20 MG tablet Take 20 mg by mouth daily.    [provider]  Probiotic Product (PROBIOTIC DAILY PO) Take by mouth.    [provider]  TURMERIC PO Take 720 mg by mouth daily.    [provider]  vitamin B-12 (CYANOCOBALAMIN) 1000 MCG tablet Take 1,000 mcg by mouth daily.    [provider]  vitamin C (ASCORBIC ACID) 500 MG tablet Take 500 mg by mouth daily.    [provider]    Family History Family History  Problem Relation Age of Onset  . Diabetes Mother   . Heart disease Mother   . Ovarian cancer Sister   . Kidney disease Sister   . Diabetes Brother   .  Heart disease Brother     Social History Social History   Tobacco Use  . Smoking status: Former Research scientist (life sciences)  . Smokeless tobacco: Never Used  Substance Use Topics  . Alcohol use: No  . Drug use: No     Allergies   Patient has no known allergies.   Review of Systems Review of Systems  Constitutional: Negative for chills and fever.  HENT: Negative for congestion, rhinorrhea and sore throat.   Eyes: Negative for visual disturbance.  Respiratory: Negative for cough and shortness of breath.   Cardiovascular: Positive for chest pain. Negative for leg swelling.  Gastrointestinal: Negative for abdominal pain, diarrhea, nausea and vomiting.  Genitourinary: Negative for dysuria.  Musculoskeletal: Negative for back pain and neck pain.  Skin: Negative for rash.   Neurological: Negative for dizziness, light-headedness and headaches.  Hematological: Does not bruise/bleed easily.  Psychiatric/Behavioral: Negative for confusion.     Physical Exam Updated Vital Signs BP 124/65   Pulse 62   Temp 99 F (37.2 C)   Resp (!) 21   Ht 1.727 m (5' 8" )   Wt 60.3 kg   SpO2 99%   BMI 20.22 kg/m   Physical Exam Vitals signs and nursing note reviewed.  Constitutional:      General: She is not in acute distress.    Appearance: She is well-developed.  HENT:     Head: Normocephalic and atraumatic.  Eyes:     Extraocular Movements: Extraocular movements intact.     Conjunctiva/sclera: Conjunctivae normal.     Pupils: Pupils are equal, round, and reactive to light.  Neck:     Musculoskeletal: Normal range of motion and neck supple.  Cardiovascular:     Rate and Rhythm: Normal rate and regular rhythm.     Heart sounds: No murmur.  Pulmonary:     Effort: Pulmonary effort is normal. No respiratory distress.     Breath sounds: Normal breath sounds.  Abdominal:     Palpations: Abdomen is soft.     Tenderness: There is no abdominal tenderness.  Musculoskeletal: Normal range of motion.        General: No swelling.  Skin:    General: Skin is warm and dry.  Neurological:     General: No focal deficit present.     Mental Status: She is alert and oriented to person, place, and time.      ED Treatments / Results  Labs (all labs ordered are listed, but only abnormal results are displayed) Labs Reviewed  BASIC METABOLIC PANEL - Abnormal; Notable for the following components:      Result Value   Glucose, Bld 103 (*)    All other components within normal limits  CBC  TROPONIN I (HIGH SENSITIVITY)  TROPONIN I (HIGH SENSITIVITY)    EKG EKG Interpretation  Date/Time:  Tuesday April 19 2019 15:27:16 EDT Ventricular Rate:  64 PR Interval:  186 QRS Duration: 142 QT Interval:  453 QTC Calculation: 468 R Axis:   68 Text Interpretation:  Sinus  rhythm Right bundle branch block Limb reversal corrected Confirmed by Fredia Sorrow [phone]) on 04/19/2019 3:30:52 PM   Radiology Dg Chest 2 View  Result Date: 04/19/2019 CLINICAL DATA:  Chest pain, sudden onset 3 days ago EXAM: CHEST - 2 VIEW COMPARISON:  None. FINDINGS: The heart size and mediastinal contours are within normal limits. Both lungs are clear. The visualized skeletal structures are unremarkable. IMPRESSION: No active cardiopulmonary disease. Electronically Signed   By: Jewel Baize.D.  On: 04/19/2019 15:04    Procedures Procedures (including critical care time)  Medications Ordered in ED Medications  sodium chloride flush (NS) 0.9 % injection 3 mL (3 mLs Intravenous Given 04/19/19 1453)     Initial Impression / Assessment and Plan / ED Course  I have reviewed the triage vital signs and the nursing notes.  Pertinent labs & imaging results that were available during my care of the patient were reviewed by me and considered in my medical decision making (see chart for details).        By history chest pain seemed very atypical.  Was only kind of sharp bouts of pain almost like pinpricks lasting only seconds.  Is occurred several times today.  Also has occurred over the last few days initially was left-sided chest now more lower substernal area.  Sent in by primary care doctor for concerns for abnormal EKG.  EKG done here is consistent with right bundle branch block.  Which is new compared to EKGs from several years ago.  Patient's chest x-ray negative not hypoxic no shortness of breath not tachycardic.  Labs and troponin without any significant findings.  Patient start a baby aspirin a day will have her follow-up with cardiology.  No evidence of an acute cardiac event.  Final Clinical Impressions(s) / ED Diagnoses   Final diagnoses:  Atypical chest pain    ED Discharge Orders    None       Fredia Sorrow, MD 04/19/19 1730

## 2019-04-20 ENCOUNTER — Telehealth: Payer: Self-pay | Admitting: Physician Assistant

## 2019-04-20 NOTE — Telephone Encounter (Signed)
Pt called about lab results.

## 2019-04-21 ENCOUNTER — Other Ambulatory Visit: Payer: Self-pay

## 2019-04-21 DIAGNOSIS — R197 Diarrhea, unspecified: Secondary | ICD-10-CM

## 2019-04-21 NOTE — Telephone Encounter (Signed)
Advised. See labs for instruction.

## 2019-04-26 ENCOUNTER — Other Ambulatory Visit: Payer: Medicare HMO

## 2019-04-26 DIAGNOSIS — R197 Diarrhea, unspecified: Secondary | ICD-10-CM

## 2019-04-29 ENCOUNTER — Telehealth: Payer: Self-pay | Admitting: Physician Assistant

## 2019-04-29 NOTE — Telephone Encounter (Signed)
Pt inquired about results of stool test.

## 2019-05-03 ENCOUNTER — Telehealth: Payer: Self-pay | Admitting: Physician Assistant

## 2019-05-03 NOTE — Telephone Encounter (Signed)
Pt called asking about stool test results.

## 2019-05-03 NOTE — Telephone Encounter (Signed)
Advised the patient the test will take another 24 to 48 hours to complete.  I have confirmed this with [facility].

## 2019-05-05 LAB — CALPROTECTIN, FECAL: Calprotectin, Fecal: 413 ug/g — ABNORMAL HIGH (ref 0–120)

## 2019-05-10 NOTE — Telephone Encounter (Signed)
Pt called again inquiring about stool test results.

## 2019-05-11 NOTE — Telephone Encounter (Signed)
Please look at the results from fecal test. I think you are trying to r/o Crohn's. Thanks

## 2019-05-11 NOTE — Telephone Encounter (Signed)
Thanks Amy I think a trial of budesonide is reasonable, would try budesonide 91m / day for 4 weeks, then decrease to 682m/ day for 2 weeks, then 53m48m day for 2 weeks, then stop. I think cross sectional imaging would be useful hear to further evaluate for Crohn's if he has not had it yet, would order MR enterography. Thanks

## 2019-05-11 NOTE — Telephone Encounter (Signed)
Natalie Carroll - this is your pt - please review my recent note - has been ? Of mild Crohns- sed rate was elevated , did fecal calprotectin which is elevated - having frequent bouts of diarrhea- want to try course of budesonide ?

## 2019-05-12 ENCOUNTER — Other Ambulatory Visit: Payer: Self-pay

## 2019-05-12 MED ORDER — BUDESONIDE 3 MG PO CPEP: ORAL_CAPSULE | ORAL | 0 refills | Status: DC

## 2019-05-12 NOTE — Telephone Encounter (Signed)
Discussed in detail with the patient on the telephone. Per her request, I have also sent her a patient message with the same information.

## 2019-05-12 NOTE — Telephone Encounter (Signed)
Natalie Carroll, please call pt - let her know the fecal calprotectin is elevated raising suspicion for IBD? Crohns as cause for diarrhea Discussed with Dr.Armbruster - we would like to give her a course of Budesonide 3 mg tabs- /Entocort..  3 tabs daily x 2 weeks, then 2 tabs daily x 2 weeks then One tab daily  X 2-4 weeks - lets make her a follow up with Dr Havery Moros in 6 weeks

## 2019-05-16 ENCOUNTER — Telehealth: Payer: Self-pay | Admitting: Physician Assistant

## 2019-05-19 NOTE — Telephone Encounter (Signed)
PA form for budesonide completed and faxed back to [facility].

## 2019-06-10 NOTE — Telephone Encounter (Signed)
[name] , I am giving this back to you to pursue with the provider. It was denied. It can be appealed if that is done before 60 days has past. Sorry I was unsuccessful. Unfortunately insurance has the power to dictate health care. The papers are in the In Box for [name]. I have highlighted the relevant area that explains what the basis for denying Budesonide is.

## 2019-06-10 NOTE — Telephone Encounter (Signed)
Spoke with patient today. She states she has the medication and has been taking it.  That she will finish her dose and then call back if she needs to be seen.

## 2019-06-13 NOTE — Telephone Encounter (Signed)
Beth - please make sure pt has a follow up office visit scheduled with Dr Havery Moros

## 2019-06-13 NOTE — Telephone Encounter (Signed)
Pt already has an OV scheduled with Dr. Havery Moros for 06/21/19@1 :20pm.

## 2019-06-21 ENCOUNTER — Ambulatory Visit: Payer: Medicare HMO | Admitting: Gastroenterology

## 2019-06-21 ENCOUNTER — Other Ambulatory Visit: Payer: Self-pay

## 2019-06-21 ENCOUNTER — Encounter: Payer: Self-pay | Admitting: Gastroenterology

## 2019-06-21 VITALS — BP 116/64 | HR 68 | Temp 98.2°F | Ht 68.0 in | Wt 135.0 lb

## 2019-06-21 DIAGNOSIS — K5000 Crohn's disease of small intestine without complications: Secondary | ICD-10-CM | POA: Diagnosis not present

## 2019-06-21 DIAGNOSIS — K529 Noninfective gastroenteritis and colitis, unspecified: Secondary | ICD-10-CM

## 2019-06-21 DIAGNOSIS — K219 Gastro-esophageal reflux disease without esophagitis: Secondary | ICD-10-CM

## 2019-06-21 MED ORDER — OMEPRAZOLE 20 MG PO CPDR: 20.0000 mg | DELAYED_RELEASE_CAPSULE | Freq: Every day | ORAL | 3 refills | Status: DC

## 2019-06-21 MED ORDER — GAVISCON EXTRA RELIEF FORMULA 508-475 MG/10ML PO SUSP: ORAL | 0 refills | Status: DC

## 2019-06-21 MED ORDER — OMEPRAZOLE 20 MG PO CPDR: DELAYED_RELEASE_CAPSULE | ORAL | 1 refills | Status: DC

## 2019-06-21 NOTE — Progress Notes (Signed)
HPI :  73 year old female here for a follow-up visit.  She has a history of chronic reflux and suspected Crohn's disease based off work-up as below, listed in chronological order.  Initial workup done in [facility] - Dr. 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 Treated with Pentasa at the time. Also received a course of Enterocort  Colonoscopy in February 2018 with me, 2 small AVMs, the terminal ileum was noted to be mildly erythematous but without ulcers and colon was normal.  Biopsies were done to rule out microscopic colitis and these were negative.  There was some focal active inflammation on ileal biopsies, nonspecific.  Question of Crohn's was again raised.  Capsule endoscopy was suggested but she decided not to follow through with that at that time.    She had some recurrent symptoms of persistent diarrhea in recent months when she presented for follow-up visit.  Labs obtained as below:  ESR 34 Fecal calprotectin 413  She was given empiric course of budesonide 9 mg a day for 2 weeks followed by 6 mg a day for 2 weeks followed by 3 mg a day for 2 weeks tapering.  She states she clearly had a benefit of her loose stools when she started taking this.  She went from 3 urgent loose stools today to 1 bowel movement that was formed per day without blood.  She has not had any abdominal pains since she initially went on the regimen.  Generally she states she is doing pretty good at this time.  She is concerned perhaps her stool is loosening up a little bit as she is recently tapered down to 3 mg for the day.  She is a bit concerned  about the cost of budesonide, it was not covered by her insurance yet she did not want to take prednisone.  We discussed options as below  Otherwise she is concerned about her long-term use of omeprazole for her reflux.  She has throat irritation with coughing and belching that frequently bothers her.  She denies much pyrosis.  She has been on omeprazole 20 mg a day for this, indefinitely reduces her symptoms and helps her feel better although does not completely resolve it.  She tried famotidine in the past which she did not find this helpful.  No dysphagia.  She is concerned about long-term risks and asked about other options.   Past Medical History:  Diagnosis Date   Colon polyp    GERD (gastroesophageal reflux disease)    Hypothyroidism    Ileitis      Past Surgical History:  Procedure Laterality Date   BREAST BIOPSY     left breast   THYROIDECTOMY, PARTIAL  1991   TUBAL LIGATION     Family History  Problem Relation Age of Onset   Diabetes Mother    Heart disease Mother    Ovarian cancer Sister    Kidney disease Sister    Diabetes Brother    Heart disease Brother    Social History   Tobacco Use   Smoking status: Former Smoker   Smokeless tobacco: Never Used  Substance Use Topics   Alcohol use: No  Drug use: No   Current Outpatient Medications  Medication Sig Dispense Refill   budesonide (ENTOCORT EC) 3 MG 24 hr capsule Take 3 capsules (9 mg total) by mouth daily for 14 days, THEN 2 capsules (6 mg total) daily for 14 days, THEN 1 capsule (3 mg total) daily. 100 capsule 0   calcium-vitamin D 250-100 MG-UNIT tablet Take 1 tablet by mouth 2 (two) times daily.     cholecalciferol (VITAMIN D3) 25 MCG (1000 UT) tablet Take 1,000 Units by mouth daily.     escitalopram (LEXAPRO) 10 MG tablet Take 1 tablet by mouth daily.     famotidine (PEPCID) 40 MG tablet Take 1 tablet (40 mg total) by mouth 2 (two) times daily. 90 tablet 3   levothyroxine  (SYNTHROID, LEVOTHROID) 112 MCG tablet Take 1 tablet by mouth daily.     Magnesium Oxide 250 MG TABS Take by mouth daily.     Misc Natural Products (JOINT HEALTH PO) Take by mouth. zyflamed by new chapter once daily     Multiple Vitamin (MULTIVITAMIN) tablet Take 1 tablet by mouth daily.     omeprazole (PRILOSEC) 20 MG capsule Take one tablet (65m) by mouth once or twice daily 180 capsule 1   pravastatin (PRAVACHOL) 20 MG tablet Take 20 mg by mouth daily.     Probiotic Product (PROBIOTIC DAILY PO) Take by mouth.     TURMERIC PO Take 720 mg by mouth daily.     vitamin B-12 (CYANOCOBALAMIN) 1000 MCG tablet Take 1,000 mcg by mouth daily.     vitamin C (ASCORBIC ACID) 500 MG tablet Take 500 mg by mouth daily.     Alum Hydroxide-Mag Carbonate (GAVISCON EXTRA RELIEF FORMULA) [phone]/10ML SUSP Take as directed, as needed  0   Current Facility-Administered Medications  Medication Dose Route Frequency Provider Last Rate Last Dose   0.9 %  sodium chloride infusion  500 mL Intravenous Continuous Emily Massar, SCarlota Raspberry MD       No Known Allergies   Review of Systems: All systems reviewed and negative except where noted in HPI.   Lab Results  Component Value Date   WBC 4.7 04/19/2019   HGB 12.7 04/19/2019   HCT 39.8 04/19/2019   MCV 94.1 04/19/2019   PLT 200 04/19/2019    Lab Results  Component Value Date   CREATININE 0.85 04/19/2019   BUN 22 04/19/2019   NA 139 04/19/2019   K 3.7 04/19/2019   CL 100 04/19/2019   CO2 28 04/19/2019    No results found for: ALT, AST, GGT, ALKPHOS, BILITOT    Physical Exam: BP 116/64 (BP Location: Left Arm, Patient Position: Sitting, Cuff Size: Normal)    Pulse 68    Temp 98.2 F (36.8 C)    Ht _0  (1.727 m) Comment: height measured without shoes   Wt 135 lb (61.2 kg)    BMI 20.53 kg/m  Constitutional: Pleasant,well-developed, female in no acute distress. HEENT: Normocephalic and atraumatic. Conjunctivae are normal. No scleral  icterus. Neck supple.  Cardiovascular: Normal rate, regular rhythm.  Pulmonary/chest: Effort normal and breath sounds normal. No wheezing, rales or rhonchi. Abdominal: Soft, nondistended, nontender.  There are no masses palpable. No hepatomegaly. Extremities: no edema Lymphadenopathy: No cervical adenopathy noted. Neurological: Alert and oriented to person place and time. Skin: Skin is warm and dry. No rashes noted. Psychiatric: Normal mood and affect. Behavior is normal.   ASSESSMENT AND PLAN: 73year old female here for reassessment following issues:  Ileitis /chronic diarrhea -  given work-up as outlined above, I suspect she may likely have mild small bowel Crohn's disease.  She is not been on any historic maintenance therapy and has had no complicating features, she mostly has loose stools with occasional pain.  We discussed what Crohn's disease is and possible treatment options.  For her if symptoms are mild and intermittent she could use budesonide /prednisone as needed although she wants to avoid steroids if at all possible.  We discussed that if her symptoms were persistent and bothersome then we could consider biologic therapy, we discussed risks of these and she certainly did not want to commit to any therapy today.  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  I offered her an MR enterography to restage her disease, assess extent of disease and severity, ensure no stricturing giving some of her pain she has had previously.  This may help [place] determine how aggressive she wants to be treated.  She wanted to pursue MR enterography.  Further recommendations pending results.  She will continue budesonide taper, if she has severe worsening of symptoms while doing this, she will contact me for discussion of other options whether she can afford another course of budesonide or to go on a course of  prednisone until we sort out her long-term management strategy.  She agreed  GERD - she has been on PPI for almost 20 years, she has questions about long-term risks of the drug which we discussed at length.  She has a prior EGD which shows no Barrett's esophagus, management is for symptom control.  I outlined long-term risks of PPIs, unfortunately I think she will need to be on some sort of PPI to main control of symptoms given she failed trial of Pepcid.  We discussed other options such as endoscopic TIF and surgery.  She is interested in TIF and wants to read more about it and will get back to me if this is something she wants to consider in the future.  Otherwise continue omeprazole 20 mg once a twice daily and can add Gaviscon as needed for breakthrough.  She agreed  Parrott Cellar, MD [facility]

## 2019-06-21 NOTE — Patient Instructions (Addendum)
If you are age 72 or older, your body mass index should be between 23-30. Your Body mass index is 20.53 kg/m. If this is out of the aforementioned range listed, please consider follow up with your Primary Care Provider.  If you are age 35 or younger, your body mass index should be between 19-25. Your Body mass index is 20.53 kg/m. If this is out of the aformentioned range listed, please consider follow up with your Primary Care Provider.   You have been scheduled for an MR Enterography of your Abdomen and Pelvis at [facility], located at [address] in the [facility]. Your appointment is scheduled on Monday, 07-04-19 at 9:00am. Please arrive 2 hours prior to your appointment time at 7:00am for registration purposes and to drink the necessary contrast. Please make certain not to have anything to eat or drink after midnight. In addition, if you have any metal in your body, have a pacemaker or defibrillator, please be sure to let your ordering physician know. This test typically takes 45 minutes to 1 hour to complete. Should you need to reschedule, please call [phone].  We have sent the following medications to your pharmacy for you to pick up at your convenience: Omeprazole 25m: Take once to twice daily  Please purchase the following medications over the counter and take as directed: Gaviscon: Take as directed as needed  We are giving you a hand out today regarding the TIF procedure for your review.   Thank you for entrusting me with your care and [facility] Dr. SCarolina Cellar

## 2019-06-27 ENCOUNTER — Other Ambulatory Visit: Payer: Self-pay

## 2019-06-27 ENCOUNTER — Telehealth: Payer: Self-pay | Admitting: Gastroenterology

## 2019-06-27 DIAGNOSIS — K219 Gastro-esophageal reflux disease without esophagitis: Secondary | ICD-10-CM

## 2019-06-27 NOTE — Telephone Encounter (Signed)
OKay thanks for letting me know. If she does wish to pursue a TIF evaluation, she will need an EGD (I think her last was in 2013) to make sure no changes since the last exam and that she is a candidate. Can you help coordinate a routine EGD at the [facility] For her if she wishes to proceed? Thanks

## 2019-06-27 NOTE — Telephone Encounter (Signed)
Patient called and wanted to change where she has her MRI entero-abd/pelvis to [place], because of less out of pocket for her than at [facility]. Cancelled at [place] will call patient to schedule. Also patient wanted to let Dr. Havery Moros know that she wants to pursue scheduling a TIFF procedure.

## 2019-06-27 NOTE — Telephone Encounter (Signed)
Patient states she has an Mri scheduled but her co-payment for a hospital setting is very expensive. She is asking if there is another location she can go to that is not at the hospital.

## 2019-06-28 NOTE — Telephone Encounter (Signed)
Patient scheduled for EGD in [place] on 07/12/19 for TIF eval. COVID testing scheduled 07/08/19 at 10:20am at [id number]. Pre-visit scheduled 06/04/19 at 1:00pm, patient to arrive at 12:45pm

## 2019-06-29 DIAGNOSIS — R072 Precordial pain: Secondary | ICD-10-CM | POA: Insufficient documentation

## 2019-06-29 DIAGNOSIS — Z7189 Other specified counseling: Secondary | ICD-10-CM | POA: Insufficient documentation

## 2019-06-29 NOTE — Progress Notes (Signed)
Cardiology Office Note   Date:  06/30/2019   ID:  Pearlean Sabina [place], [place] 1945-11-25, MRN [id number]  PCP:  Katherina Mires, MD  Cardiologist:   Minus Breeding, MD Referring:  Katherina Mires, MD  Chief Complaint  Patient presents with  . Chest Pain      History of Present Illness: Natalie Carroll is a 73 y.o. female who is referred by the Katherina Mires, MD for evaluation of chest pain.  The patient has no past cardiac history.  She was in the emergency room in September for chest pain.  I reviewed these records.  She was noted to have a right bundle branch block and there were no old EKGs for comparison.  She never heard about this before.  She is had no past cardiac history or work-up.  She is active.  She exercises routinely.  To get on a treadmill.  She goes to gym.  With all of this she denies any cardiovascular symptoms.  However, on the day that she was having problems she developed some flashing discomfort in her left chest that then became a pressure.  It would come and go.  It was unprovoked.  It was mild to moderate.  There were no associated symptoms such as nausea vomiting or diaphoresis.  She denies any shortness of breath, PND or orthopnea.  She denies any palpitations, presyncope or syncope.  She had no weight gain or edema.  It went away on its own and she has not been having it.  Past Medical History:  Diagnosis Date  . Colon polyp   . GERD (gastroesophageal reflux disease)   . Hypothyroidism   . Ileitis     Past Surgical History:  Procedure Laterality Date  . BREAST BIOPSY     left breast  . THYROIDECTOMY, PARTIAL  1991  . TUBAL LIGATION       Current Outpatient Medications  Medication Sig Dispense Refill  . calcium-vitamin D 250-100 MG-UNIT tablet Take 1 tablet by mouth 2 (two) times daily.    . cholecalciferol (VITAMIN D3) 25 MCG (1000 UT) tablet Take 1,000 Units by mouth daily.    Marland Kitchen escitalopram (LEXAPRO) 10 MG tablet Take 1 tablet by mouth  daily.    Marland Kitchen levothyroxine (SYNTHROID, LEVOTHROID) 112 MCG tablet Take 1 tablet by mouth daily.    . Magnesium Oxide 250 MG TABS Take by mouth daily.    . Multiple Vitamin (MULTIVITAMIN) tablet Take 1 tablet by mouth daily.    Marland Kitchen omeprazole (PRILOSEC) 20 MG capsule Take one tablet (67m) by mouth once or twice daily 180 capsule 1  . pravastatin (PRAVACHOL) 20 MG tablet Take 20 mg by mouth daily.    . Probiotic Product (PROBIOTIC DAILY PO) Take by mouth.    . TURMERIC PO Take 720 mg by mouth daily.    . vitamin B-12 (CYANOCOBALAMIN) 1000 MCG tablet Take 1,000 mcg by mouth daily.    . vitamin C (ASCORBIC ACID) 500 MG tablet Take 500 mg by mouth daily.    . Alum Hydroxide-Mag Carbonate (GAVISCON EXTRA RELIEF FORMULA) 508-475 MG/10ML SUSP Take as directed, as needed  0   Current Facility-Administered Medications  Medication Dose Route Frequency Provider Last Rate Last Dose  . 0.9 %  sodium chloride infusion  500 mL Intravenous Continuous Armbruster, SCarlota Raspberry MD        Allergies:   Patient has no known allergies.    Social History:  The patient  reports that  she quit smoking about 29 years ago. She has never used smokeless tobacco. She reports that she does not drink alcohol or use drugs.   Family History:  The patient's family history includes Bladder Cancer in her sister; CVA in her sister; Diabetes in her brother and mother; Heart disease in her brother; Heart disease (age of onset: 28) in her mother; Kidney disease in her sister; Ovarian cancer in her sister.    ROS:  Please see the history of present illness.   Otherwise, review of systems are positive for insomnia and reflux.   All other systems are reviewed and negative.    PHYSICAL EXAM: VS:  BP 116/66   Pulse 63   Temp (!) 97.3 F (36.3 C)   Ht 5' 8"  (1.727 m)   Wt 135 lb 12.8 oz (61.6 kg)   SpO2 96%   BMI 20.65 kg/m  , BMI Body mass index is 20.65 kg/m. GENERAL:  Well appearing HEENT:  Pupils equal round and reactive,  fundi not visualized, oral mucosa unremarkable NECK:  No jugular venous distention, waveform within normal limits, carotid upstroke brisk and symmetric, no bruits, no thyromegaly LYMPHATICS:  No cervical, inguinal adenopathy LUNGS:  Clear to auscultation bilaterally BACK:  No CVA tenderness CHEST:  Unremarkable HEART:  PMI not displaced or sustained,S1 and S2 within normal limits, no S3, no S4, no clicks, no rubs, no murmurs ABD:  Flat, positive bowel sounds normal in frequency in pitch, no bruits, no rebound, no guarding, no midline pulsatile mass, no hepatomegaly, no splenomegaly EXT:  2 plus pulses throughout, no edema, no cyanosis no clubbing SKIN:  No rashes no nodules NEURO:  Cranial nerves II through XII grossly intact, motor grossly intact throughout PSYCH:  Cognitively intact, oriented to person place and time    EKG:  EKG is ordered today. The ekg ordered today demonstrates normal sinus rhythm, rate 63, right bundle branch block, no acute ST-T wave changes.   Recent Labs: 04/19/2019: BUN 22; Creatinine, Ser 0.85; Hemoglobin 12.7; Platelets 200; Potassium 3.7; Sodium 139    Lipid Panel No results found for: CHOL, TRIG, HDL, CHOLHDL, VLDL, LDLCALC, LDLDIRECT    Wt Readings from Last 3 Encounters:  06/30/19 135 lb 12.8 oz (61.6 kg)  06/21/19 135 lb (61.2 kg)  04/19/19 133 lb (60.3 kg)      Other studies Reviewed: Additional studies/ records that were reviewed today include: ED records. Review of the above records demonstrates:  Please see elsewhere in the note.     ASSESSMENT AND PLAN:  CHEST PAIN:   The patient's chest discomfort was atypical.  It has gone away.  She has no significant cardiovascular risk factors.  There is a very low pretest probability that this was ischemic in nature.  No change in therapy.  RBBB: There is no way of knowing how long this has existed.  It was present in September.  She is not having any bradycardic symptoms.  Otherwise no change in  therapy but I will check an echocardiogram to make sure she has a structurally normal heart.  COVID EDUCATION: We talked about the possibilities of the vaccine.  Current medicines are reviewed at length with the patient today.  The patient does not have concerns regarding medicines.  The following changes have been made:  no change  Labs/ tests ordered today include:   Orders Placed This Encounter  Procedures  . ECHOCARDIOGRAM COMPLETE     Disposition:   FU with me as needed.  Signed, Minus Breeding, MD  06/30/2019 12:49 PM    [facility]

## 2019-06-30 ENCOUNTER — Encounter

## 2019-06-30 ENCOUNTER — Encounter: Payer: Self-pay | Admitting: Cardiology

## 2019-06-30 ENCOUNTER — Other Ambulatory Visit: Payer: Self-pay

## 2019-06-30 ENCOUNTER — Ambulatory Visit: Payer: Medicare HMO | Admitting: Cardiology

## 2019-06-30 VITALS — BP 116/66 | HR 63 | Temp 97.3°F | Ht 68.0 in | Wt 135.8 lb

## 2019-06-30 DIAGNOSIS — Z7189 Other specified counseling: Secondary | ICD-10-CM

## 2019-06-30 DIAGNOSIS — R072 Precordial pain: Secondary | ICD-10-CM

## 2019-06-30 DIAGNOSIS — R9431 Abnormal electrocardiogram [ECG] [EKG]: Secondary | ICD-10-CM

## 2019-06-30 NOTE — Patient Instructions (Signed)
Medication Instructions:  Your physician recommends that you continue on your current medications as directed. Please refer to the Current Medication list given to you today.  *If you need a refill on your cardiac medications before your next appointment, please call your pharmacy*  Lab Work: NONE If you have labs (blood work) drawn today and your tests are completely normal, you will receive your results only by: Marland Kitchen MyChart Message (if you have MyChart) OR . A paper copy in the mail If you have any lab test that is abnormal or we need to change your treatment, we will call you to review the results.  Testing/Procedures: Your physician has requested that you have an echocardiogram. Echocardiography is a painless test that uses sound waves to create images of your heart. It provides your doctor with information about the size and shape of your heart and how well your heart's chambers and valves are working. This procedure takes approximately one hour. There are no restrictions for this procedure. LOCATION: [facility]: [address], [place], [place] [address]   Follow-Up: At [facility], you and your health needs are our priority.  As part of our continuing mission to provide you with exceptional heart care, we have created designated Provider Care Teams.  These Care Teams include your primary Cardiologist (physician) and Advanced Practice Providers (APPs -  Physician Assistants and Nurse Practitioners) who all work together to provide you with the care you need, when you need it.  Your next appointment:   AS NEEDED  The format for your next appointment:   In Person  Provider:   You may see DR. HOCHREIN or one of the following Advanced Practice Providers on your designated Care Team:    Rosaria Ferries, PA-C  Jory Sims, DNP, ANP  Cadence Kathlen Mody, NP

## 2019-07-04 ENCOUNTER — Other Ambulatory Visit (HOSPITAL_COMMUNITY): Payer: Medicare HMO

## 2019-07-04 ENCOUNTER — Other Ambulatory Visit: Payer: Self-pay

## 2019-07-04 ENCOUNTER — Ambulatory Visit (AMBULATORY_SURGERY_CENTER): Payer: Self-pay

## 2019-07-04 ENCOUNTER — Other Ambulatory Visit (INDEPENDENT_AMBULATORY_CARE_PROVIDER_SITE_OTHER): Payer: Medicare HMO

## 2019-07-04 ENCOUNTER — Ambulatory Visit (HOSPITAL_COMMUNITY): Payer: Medicare HMO

## 2019-07-04 VITALS — Temp 96.9°F | Ht 68.0 in | Wt 135.4 lb

## 2019-07-04 DIAGNOSIS — R9431 Abnormal electrocardiogram [ECG] [EKG]: Secondary | ICD-10-CM

## 2019-07-04 DIAGNOSIS — R072 Precordial pain: Secondary | ICD-10-CM | POA: Diagnosis not present

## 2019-07-04 DIAGNOSIS — K219 Gastro-esophageal reflux disease without esophagitis: Secondary | ICD-10-CM

## 2019-07-04 NOTE — Progress Notes (Signed)
Denies allergies to eggs or soy products. Denies complication of anesthesia or sedation. Denies use of weight loss medication. Denies use of O2.   Emmi instructions given for colonoscopy.  Covid screening is scheduled for Friday 07/08/19 @ 10:20 Am.

## 2019-07-07 ENCOUNTER — Telehealth: Payer: Self-pay | Admitting: Gastroenterology

## 2019-07-07 NOTE — Telephone Encounter (Signed)
Pt stated that her MRI scheduled 07/26/19 has not been approved by insurance.

## 2019-07-07 NOTE — Telephone Encounter (Signed)
Called patient and gave her update on her pre-authorization for her MR entero

## 2019-07-07 NOTE — Telephone Encounter (Signed)
I called the patient back, and she states she called her Ins. to see if her MR entero scheduled for 07/26/19 had been approved and they said they didn't show it being submitted for pre-certification. It looks like the amb ref gastro was put in on 06/21/19. Do you know the status of this ?

## 2019-07-07 NOTE — Telephone Encounter (Signed)
This appt was booked at [facility] on 07/05/19.  They used your original order that was scheduled @ [facility] for the studies.  We do authorizations on studies no earlier than 2 weeks prior to procedure because some insurances only give [place] a limited time window for the auth.  This case should be started next week. Thanks, Amy

## 2019-07-08 ENCOUNTER — Ambulatory Visit (INDEPENDENT_AMBULATORY_CARE_PROVIDER_SITE_OTHER): Payer: Medicare HMO

## 2019-07-08 DIAGNOSIS — Z1159 Encounter for screening for other viral diseases: Secondary | ICD-10-CM

## 2019-07-09 LAB — SARS CORONAVIRUS 2 (TAT 6-24 HRS): SARS Coronavirus 2: NEGATIVE

## 2019-07-11 ENCOUNTER — Other Ambulatory Visit: Payer: Self-pay

## 2019-07-11 ENCOUNTER — Ambulatory Visit (HOSPITAL_COMMUNITY): Payer: Medicare HMO | Attending: Cardiovascular Disease

## 2019-07-11 DIAGNOSIS — R9431 Abnormal electrocardiogram [ECG] [EKG]: Secondary | ICD-10-CM | POA: Diagnosis not present

## 2019-07-12 ENCOUNTER — Encounter: Payer: Self-pay | Admitting: Gastroenterology

## 2019-07-12 ENCOUNTER — Ambulatory Visit (AMBULATORY_SURGERY_CENTER): Payer: Medicare HMO | Admitting: Gastroenterology

## 2019-07-12 VITALS — BP 138/93 | HR 59 | Temp 98.8°F | Resp 19 | Ht 68.0 in | Wt 135.0 lb

## 2019-07-12 DIAGNOSIS — K2100 Gastro-esophageal reflux disease with esophagitis, without bleeding: Secondary | ICD-10-CM | POA: Diagnosis not present

## 2019-07-12 DIAGNOSIS — K317 Polyp of stomach and duodenum: Secondary | ICD-10-CM

## 2019-07-12 MED ORDER — SODIUM CHLORIDE 0.9 % IV SOLN: 500.0000 mL | INTRAVENOUS | Status: DC

## 2019-07-12 NOTE — Progress Notes (Signed)
No problems noted in the recovery room. maw 

## 2019-07-12 NOTE — Progress Notes (Signed)
Report given to PACU, vss 

## 2019-07-12 NOTE — Op Note (Signed)
[place] Patient Name: Natalie Carroll Procedure Date: 07/12/2019 1:39 PM MRN: [id number] Endoscopist: Remo Lipps P. Havery Moros , MD Age: 73 Referring MD:  Date of Birth: 1946-06-25 Gender: Female Account #: [id number] Procedure:                Upper GI endoscopy Indications:              gastro-esophageal reflux disease, patient wishes to                            come off omeprazole which works to control                            symptoms, failed pepcid, consideration for possible                            TIF, rule out Barett's Medicines:                Monitored Anesthesia Care Procedure:                Pre-Anesthesia Assessment:                           - Prior to the procedure, a History and Physical                            was performed, and patient medications and                            allergies were reviewed. The patient's tolerance of                            previous anesthesia was also reviewed. The risks                            and benefits of the procedure and the sedation                            options and risks were discussed with the patient.                            All questions were answered, and informed consent                            was obtained. Prior Anticoagulants: The patient has                            taken no previous anticoagulant or antiplatelet                            agents. ASA Grade Assessment: II - A patient with                            mild systemic disease. After reviewing the risks  and benefits, the patient was deemed in                            satisfactory condition to undergo the procedure.                           After obtaining informed consent, the endoscope was                            passed under direct vision. Throughout the                            procedure, the patient's blood pressure, pulse, and                            oxygen saturations were  monitored continuously. The                            Endoscope was introduced through the mouth, and                            advanced to the second part of duodenum. The upper                            GI endoscopy was accomplished without difficulty.                            The patient tolerated the procedure well. Scope In: Scope Out: Findings:                 Esophagogastric landmarks were identified: the                            Z-line was found at 43 cm, the gastroesophageal                            junction was found at 43 cm and the upper extent of                            the gastric folds was found at 43 cm from the                            incisors.                           The exam of the esophagus was otherwise normal.                           A few 3 mm sessile polyps were found in the gastric                            body. A few were removed with cold forceps for  histology for sampling, suspect benign fundic gland                            polyps in the setting of PPI use.                           The exam of the stomach was otherwise normal.                            Retroflexed views of the cardia show Hill grade I.                           The duodenal bulb and second portion of the                            duodenum were normal. Complications:            No immediate complications. Estimated blood loss:                            Minimal. Estimated Blood Loss:     Estimated blood loss was minimal. Impression:               - Esophagogastric landmarks identified.                           - Normal esophagus - no Barrett's, no signficant                            hiatal hernia                           - A few benign appearing gastric polyps. A few                            sampled / removed with cold forceps                           - Normal stomach otherwise                           - Normal duodenal bulb and  second portion of the                            duodenum. Recommendation:           - Patient has a contact number available for                            emergencies. The signs and symptoms of potential                            delayed complications were discussed with the                            patient. Return to normal activities tomorrow.  Written discharge instructions were provided to the                            patient.                           - Resume previous diet.                           - Continue present medications.                           - Await pathology results.                           - Recommend barium swallow to assess for objective                            evidence of reflux. If positive, will refer to Dr.                            Bryan Lemma for consideration for TIF Stefania Goulart P. Viktorya Arguijo, MD 07/12/2019 1:53:50 PM This report has been signed electronically.

## 2019-07-12 NOTE — Progress Notes (Signed)
Pt's states no medical or surgical changes since previsit or office visit.  LC temps, Cw vitals and JD IV.

## 2019-07-12 NOTE — Patient Instructions (Addendum)
YOU HAD AN ENDOSCOPIC PROCEDURE TODAY AT [facility]:   Refer to the procedure report that was given to you for any specific questions about what was found during the examination.  If the procedure report does not answer your questions, please call your gastroenterologist to clarify.  If you requested that your care partner not be given the details of your procedure findings, then the procedure report has been included in a sealed envelope for you to review at your convenience later.  YOU SHOULD EXPECT: Some feelings of bloating in the abdomen. Passage of more gas than usual.  Walking can help get rid of the air that was put into your GI tract during the procedure and reduce the bloating. If you had a lower endoscopy (such as a colonoscopy or flexible sigmoidoscopy) you may notice spotting of blood in your stool or on the toilet paper. If you underwent a bowel prep for your procedure, you may not have a normal bowel movement for a few days.  Please Note:  You might notice some irritation and congestion in your nose or some drainage.  This is from the oxygen used during your procedure.  There is no need for concern and it should clear up in a day or so.  SYMPTOMS TO REPORT IMMEDIATELY:     Following upper endoscopy (EGD)  Vomiting of blood or coffee ground material  New chest pain or pain under the shoulder blades  Painful or persistently difficult swallowing  New shortness of breath  Fever of 100F or higher  Black, tarry-looking stools  For urgent or emergent issues, a gastroenterologist can be reached at any hour by calling [phone].   DIET:  We do recommend a small meal at first, but then you may proceed to your regular diet.  Drink plenty of fluids but you should avoid alcoholic beverages for 24 hours.  ACTIVITY:  You should plan to take it easy for the rest of today and you should NOT DRIVE or use heavy machinery until tomorrow (because of the sedation medicines  used during the test).    FOLLOW UP: Our staff will call the number listed on your records 48-72 hours following your procedure to check on you and address any questions or concerns that you may have regarding the information given to you following your procedure. If we do not reach you, we will leave a message.  We will attempt to reach you two times.  During this call, we will ask if you have developed any symptoms of COVID 19. If you develop any symptoms (ie: fever, flu-like symptoms, shortness of breath, cough etc.) before then, please call [phone].  If you test positive for Covid 19 in the 2 weeks post procedure, please call and report this information to [place].    If any biopsies were taken you will be contacted by phone or by letter within the next 1-3 weeks.  Please call us at [phone] if you have not heard about the biopsies in 3 weeks.    SIGNATURES/CONFIDENTIALITY: You and/or your care partner have signed paperwork which will be entered into your electronic medical record.  These signatures attest to the fact that that the information above on your After Visit Summary has been reviewed and is understood.  Full responsibility of the confidentiality of this discharge information lies with you and/or your care-partner.     You may resume your current medications today. Await biopsy results. Recommend Barium Swallow to assess for objective  evidence of reflux.  If positive, will refer to Dr. Cathleen Corti for consideration for Transoral Incisionless Fundoplication. Please call if any questions or concerns.

## 2019-07-13 ENCOUNTER — Other Ambulatory Visit: Payer: Self-pay

## 2019-07-13 ENCOUNTER — Telehealth: Payer: Self-pay

## 2019-07-13 DIAGNOSIS — K2100 Gastro-esophageal reflux disease with esophagitis, without bleeding: Secondary | ICD-10-CM

## 2019-07-13 NOTE — Telephone Encounter (Signed)
Called patient and scheduled her for a Barium Swallow study at [facility] on 07/27/19. Patient to arrive at 9:15am and be NPO 3 hours before

## 2019-07-13 NOTE — Telephone Encounter (Signed)
-----   Message from Yetta Flock, MD sent at 07/12/2019  4:50 PM EST ----- Regarding: barium swallow Sherlynn Stalls, Can you help contact this patient and coordinate a barium swallow? Diagnosis is reflux. Doing this to see if she is a candidate for TIF. Thanks

## 2019-07-14 ENCOUNTER — Telehealth: Payer: Self-pay | Admitting: *Deleted

## 2019-07-14 NOTE — Telephone Encounter (Signed)
  Follow up Call-  Call back number 07/12/2019  Post procedure Call Back phone  # [phone]  Permission to leave phone message Yes  Some recent data might be hidden     Patient questions:  Do you have a fever, pain , or abdominal swelling? No. Pain Score  0 *  Have you tolerated food without any problems? Yes.    Have you been able to return to your normal activities? Yes.    Do you have any questions about your discharge instructions: Diet   No. Medications  No. Follow up visit  No.  Do you have questions or concerns about your Care? No.  Actions: * If pain score is 4 or above: No action needed, pain <4.  1. Have you developed a fever since your procedure? no  2.   Have you had an respiratory symptoms (SOB or cough) since your procedure? no  3.   Have you tested positive for COVID 19 since your procedure no  4.   Have you had any family members/close contacts diagnosed with the COVID 19 since your procedure?  no   If yes to any of these questions please route to Joylene John, RN and Alphonsa Gin, Therapist, sports.

## 2019-07-26 ENCOUNTER — Other Ambulatory Visit: Payer: Self-pay

## 2019-07-26 ENCOUNTER — Ambulatory Visit
Admission: RE | Admit: 2019-07-26 | Discharge: 2019-07-26 | Disposition: A | Discharge status: Home / Self Care | Payer: Medicare HMO | Source: Ambulatory Visit | Attending: Gastroenterology | Admitting: Gastroenterology

## 2019-07-26 DIAGNOSIS — K5000 Crohn's disease of small intestine without complications: Secondary | ICD-10-CM

## 2019-07-26 DIAGNOSIS — K529 Noninfective gastroenteritis and colitis, unspecified: Secondary | ICD-10-CM

## 2019-07-26 MED ORDER — GADOBENATE DIMEGLUMINE 529 MG/ML IV SOLN
12.0000 mL | Freq: Once | INTRAVENOUS | Status: AC | PRN
  Administered 2019-07-26: 12 mL via INTRAVENOUS

## 2019-07-27 ENCOUNTER — Ambulatory Visit (HOSPITAL_COMMUNITY)
Admission: RE | Admit: 2019-07-27 | Discharge: 2019-07-27 | Disposition: A | Discharge status: Home / Self Care | Payer: Medicare HMO | Source: Ambulatory Visit | Attending: Gastroenterology | Admitting: Gastroenterology

## 2019-07-27 ENCOUNTER — Telehealth: Payer: Self-pay | Admitting: Gastroenterology

## 2019-07-27 ENCOUNTER — Telehealth: Payer: Self-pay

## 2019-07-27 DIAGNOSIS — K2100 Gastro-esophageal reflux disease with esophagitis, without bleeding: Secondary | ICD-10-CM

## 2019-07-27 NOTE — Telephone Encounter (Signed)
Call pt to schedule F/U to colonoscopy visit in February for ileal Crohn's disease. See result note.

## 2019-07-27 NOTE — Telephone Encounter (Signed)
Called patient back and she states she is having a lot of gas and belching. Wanted to know what she could take. I suggested Gas-x and ginger-ale or sprite.

## 2019-07-27 NOTE — Telephone Encounter (Signed)
Pt would like to know what medication she can take for belching.

## 2019-07-28 NOTE — Telephone Encounter (Signed)
LM for pt to call the office to schedule a f/u visit in February with [facility] to discuss results of colonoscopy/MR enterography and Barium Swallow

## 2019-08-01 ENCOUNTER — Telehealth: Payer: Self-pay

## 2019-08-01 NOTE — Telephone Encounter (Signed)
Pt scheduled for appt in Februaury

## 2019-08-01 NOTE — Telephone Encounter (Signed)
Pt scheduled for Appt on 2-5.

## 2019-08-19 ENCOUNTER — Ambulatory Visit: Payer: Medicare HMO | Attending: Internal Medicine

## 2019-08-19 ENCOUNTER — Ambulatory Visit: Payer: Medicare HMO

## 2019-08-19 DIAGNOSIS — Z23 Encounter for immunization: Secondary | ICD-10-CM | POA: Insufficient documentation

## 2019-08-19 NOTE — Progress Notes (Signed)
   Covid-19 Vaccination Clinic  Name:  Natalie Carroll    MRN: [id number] DOB: 1946/04/23  08/19/2019  Natalie Carroll was observed post Covid-19 immunization for 15 minutes without incidence. She was provided with Vaccine Information Sheet and instruction to access the V-Safe system.   Natalie Carroll was instructed to call 911 with any severe reactions post vaccine: Marland Kitchen Difficulty breathing  . Swelling of your face and throat  . A fast heartbeat  . A bad rash all over your body  . Dizziness and weakness    Immunizations Administered    Name Date Dose VIS Date Route   Pfizer COVID-19 Vaccine 08/19/2019 10:07 AM 0.3 mL 07/08/2019 Intramuscular   Manufacturer: [place]   Lot: [id number]   [place]: [id number]

## 2019-09-02 ENCOUNTER — Other Ambulatory Visit (INDEPENDENT_AMBULATORY_CARE_PROVIDER_SITE_OTHER): Payer: Medicare HMO

## 2019-09-02 ENCOUNTER — Other Ambulatory Visit: Payer: Self-pay

## 2019-09-02 ENCOUNTER — Ambulatory Visit: Payer: Medicare HMO | Admitting: Gastroenterology

## 2019-09-02 ENCOUNTER — Encounter: Payer: Self-pay | Admitting: Gastroenterology

## 2019-09-02 VITALS — BP 98/72 | HR 65 | Temp 99.2°F | Ht 68.0 in | Wt 132.1 lb

## 2019-09-02 DIAGNOSIS — K5000 Crohn's disease of small intestine without complications: Secondary | ICD-10-CM | POA: Diagnosis not present

## 2019-09-02 DIAGNOSIS — K219 Gastro-esophageal reflux disease without esophagitis: Secondary | ICD-10-CM | POA: Diagnosis not present

## 2019-09-02 LAB — COMPREHENSIVE METABOLIC PANEL
ALT: 13 U/L (ref 0–35)
AST: 17 U/L (ref 0–37)
Albumin: 4.3 g/dL (ref 3.5–5.2)
Alkaline Phosphatase: 95 U/L (ref 39–117)
BUN: 18 mg/dL (ref 6–23)
CO2: 30 mEq/L (ref 19–32)
Calcium: 10.3 mg/dL (ref 8.4–10.5)
Chloride: 100 mEq/L (ref 96–112)
Creatinine, Ser: 0.84 mg/dL (ref 0.40–1.20)
GFR: 66.49 mL/min (ref >60.00)
Glucose, Bld: 92 mg/dL (ref 70–99)
Potassium: 4.3 mEq/L (ref 3.5–5.1)
Sodium: 136 mEq/L (ref 135–145)
Total Bilirubin: 0.2 mg/dL (ref 0.2–1.2)
Total Protein: 7 g/dL (ref 6.0–8.3)

## 2019-09-02 LAB — MAGNESIUM: Magnesium: 2.1 mg/dL (ref 1.5–2.5)

## 2019-09-02 NOTE — Progress Notes (Signed)
HPI :  74 year old female here for a follow-up visit for suspected Crohn's disease and GERD.  She has a suspected Crohn's disease based off work-up as below, listed in chronological order.  Initial workup done in [place] - Dr. Vella Redhead  CT enterography in 2011 showed focal ileitis IBD serology panel NEGATIVE  01/07/2012 - colonoscopy showed nonerosive ileitis to the distal ileum, normal colon - path not in records 01/07/2012 - EGD showed mild gastritis, normal duodenum  02/24/2012 she had a capsule endoscopy showing focal areas of active enteritis in the distal jejunum / proximal ileum. Treated with Pentasa at the time. Also received a course of Enterocort  Colonoscopy in February 2018 with me, 2 small AVMs, the terminal ileum was noted to be mildly erythematous but without ulcers and colon was normal. Biopsies were done to rule out microscopic colitis and these were negative. There was some focal active inflammation on ileal biopsies, nonspecific. Question of Crohn's was again raised. Capsule endoscopy was suggested but she decided not to follow through with that at that time.   Labs obtained in 2020 during symptoms of diarrhea, as below:  ESR 34 Fecal calprotectin 413  She was given an empiric course of budesonide previously which provided some benefit for her.  She eventually tapered off this.  We performed a follow-up MR enterography to further assess for active inflammation of her bowel and restage things since her last visit.  Results as follows  MRE 07/26/19 -  IMPRESSION: 1. Loops of distal ileum demonstrate abnormal wall thickening and mucosal enhancement along with some small areas of skip lesions, favoring active terminal ileitis/Crohn's disease. No abscess is identified. No definite involvement of the stomach, jejunum, or colon. 2. Prominence of stool in the colon as can be encountered in the setting of constipation; no current colonic air-fluid  levels are identified to favor active diarrheal process. 3. Scattered small nonenhancing fluid signal intensity lesions in the liver favoring cysts. 4. 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 5.  Aortic Atherosclerosis (ICD10-I70.0). 6. Lumbar spondylosis and degenerative disc disease.  She has finished a course of budesonide within the past month.  This did help improve some of her symptoms however she states she had a slight flare again the other night.  Has been having intermittent diarrhea, this often bothers her at nights.  She has not had any abdominal pains recently, however her weight has been about 3 to 4 pounds below her normal baseline over the past few months.  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  She uses Tylenol as needed.  She uses turmeric occasionally as well.  She endorses a lot of gas and bloating that bothers her.   Otherwise the patient has been quite interested in coming off PPI in regards to her reflux symptoms.  She is concerned about the long-term risks of chronic PPIs.  Omeprazole 20 mg a day minimizes her reflux symptoms however she does have occasional breakthrough that bothers her.  After our last visit she was interested in proceeding with an evaluation for possible TIF in order to come off PPIs.  She has been on PPIs for over 20 years.   EGD 07/12/19 -  - The exam of the esophagus was normal, no hiatal hernia. - A few 3 mm sessile polyps were found in the gastric body. A few were removed with cold forceps for histology for sampling, suspect benign fundic gland polyps in the setting of PPI use. -  The exam of the stomach was otherwise normal. Retroflexed views of the cardia show Hill grade I. - The duodenal bulb and second portion of the duodenum were normal.  Barium swallow 07/27/19 - normal study - no reflux seen   Past Medical History:  Diagnosis Date  . Allergy   . Cancer [facility])    Skin  cancer  . Colon polyp   . GERD (gastroesophageal reflux disease)   . Hyperlipidemia   . Hypothyroidism   . Ileitis   . Osteopenia   . Sleep apnea      Past Surgical History:  Procedure Laterality Date  . BREAST BIOPSY     left breast  . SKIN CANCER EXCISION    . THYROIDECTOMY, PARTIAL  1991  . TUBAL LIGATION     Family History  Problem Relation Age of Onset  . Diabetes Mother   . Heart disease Mother 32       Stents  . Ovarian cancer Sister   . Kidney disease Sister   . Bladder Cancer Sister   . Diabetes Brother   . Heart disease Brother        Valve surgery  . CVA Sister   . Colon cancer Neg Hx   . Esophageal cancer Neg Hx   . Rectal cancer Neg Hx   . Stomach cancer Neg Hx    Social History   Tobacco Use  . Smoking status: Former Smoker    Quit date: 07/28/1989    Years since quitting: 30.1  . Smokeless tobacco: Never Used  Substance Use Topics  . Alcohol use: No  . Drug use: No   Current Outpatient Medications  Medication Sig Dispense Refill  . acetaminophen (TYLENOL) 325 MG tablet Take 650 mg by mouth every 6 (six) hours as needed.    . Alum Hydroxide-Mag Carbonate (GAVISCON EXTRA RELIEF FORMULA) 508-475 MG/10ML SUSP Take as directed, as needed  0  . calcium-vitamin D 250-100 MG-UNIT tablet Take 1 tablet by mouth 2 (two) times daily.    . cholecalciferol (VITAMIN D3) 25 MCG (1000 UT) tablet Take 1,000 Units by mouth daily.    Marland Kitchen escitalopram (LEXAPRO) 10 MG tablet Take 1 tablet by mouth daily.    Marland Kitchen levothyroxine (SYNTHROID, LEVOTHROID) 112 MCG tablet Take 1 tablet by mouth daily.    . Magnesium Oxide 250 MG TABS Take by mouth daily.    . Multiple Vitamin (MULTIVITAMIN) tablet Take 1 tablet by mouth daily.    Marland Kitchen omeprazole (PRILOSEC) 20 MG capsule Take one tablet (70m) by mouth once or twice daily 180 capsule 1  . OVER THE COUNTER MEDICATION Vitamin E, one capsule daily.    .Marland KitchenOVER THE COUNTER MEDICATION [facility] One capsule daily.    . pravastatin  (PRAVACHOL) 20 MG tablet Take 20 mg by mouth daily.    . TURMERIC PO Take 720 mg by mouth daily.    . vitamin B-12 (CYANOCOBALAMIN) 1000 MCG tablet Take 1,000 mcg by mouth daily.    . vitamin C (ASCORBIC ACID) 500 MG tablet Take 500 mg by mouth daily.    . Probiotic Product (PROBIOTIC DAILY PO) Take by mouth.     No current facility-administered medications for this visit.   No Known Allergies   Review of Systems: All systems reviewed and negative except where noted in HPI.    No results found.  Physical Exam: BP 98/72 (BP Location: Left Arm, Patient Position: Sitting, Cuff Size: Normal)   Pulse 65   Temp 99.2  F (37.3 C) (Oral)   Ht 5' 8"  (1.727 m)   Wt 132 lb 2 oz (59.9 kg)   BMI 20.09 kg/m  Constitutional: Pleasant,well-developed, female in no acute distress. Lymphadenopathy: No cervical adenopathy noted. Neurological: Alert and oriented to person place and time. Skin: Skin is warm and dry. No rashes noted. Psychiatric: Normal mood and affect. Behavior is normal.   ASSESSMENT AND PLAN: 74 year old female here for reassessment of the following:  Crohn's disease of the small bowel - at this point, given work-up to date over time which has consistently showed ileitis on colonoscopy and CT/MRI imaging along with elevated fecal calprotectin and improvement with steroids, she very likely has Crohn's disease.  We discussed what this is.  She has had a mild course of this over the past several years however she does have ongoing persistent symptoms that do bother her.  Fortunately she has not had flares requiring hospitalization or complications from Crohn's disease, no surgeries.  I discussed options with her at length.  One option would be to use budesonide as needed if her symptoms are mild, this does appear to help her however sounds like her symptoms bother her frequently enough where she would want maintenance therapy.  In this light I discussed biologic therapies to include  Humira, Remicade, Stelara, Entyvio.  I also discussed immunomodulators such as thiopurine's and methotrexate, and sulfasalazine.  Unfortunately we discussed that to treat small bowel disease with maintenance therapy would entail either a biologic or immunomodulator.  She asks about treating this naturally with supplements, we discussed turmeric and curcumin, however there is no good data that monotherapy with either of these will put or keep her in remission.  We had a lengthy discussion about the risks of biologic and immunomodulators therapy.  I think given the safety profile of Entyvio she would favor this however this may be the most expensive option in regards to return to coverage etc.  I will have our staff look into how much Entyvio would be for her as well as Humira.  We will send blood work to test quant from gold, hepatitis B and C, and get baseline c-Met.  She has to check her magnesium levels in regards to leg cramping.  I will let her know the results of labs when they return to see if she is a candidate for biologic therapy and get back to her once we have more information about what her insurance will cover in regards to her regimen.  She agreed with the plan, all questions answered.  GERD - longstanding PPI use more than 20 years, she is hoping to come off PPI and undergo TIF.  Based on EGD she is a good candidate for this however we do need to confirm objective evidence of reflux prior to proceeding with TIF.  Unfortunately her barium swallow did not show obvious reflux.  We will need to either do a 24-hour pH impedance study or Bravo off PPI to confirm reflux prior to TIF referral.  We discussed what these would entail.  She wants to consider these at some point time but is concerned about cost.  I will again reach out to our staff to see how much pH impedance study would cost her and let her know.  Otherwise she will continue omeprazole and can consider twice daily dosing if she is having  breakthrough on once daily dosing.  She agreed  I spent 45 minutes of time, including in depth chart review, independent review of  results as outlined above, communicating results with the patient directly, face-to-face time with the patient, coordinating care, and ordering studies and medications as appropriate, and documenting this encounter.  [place] Cellar, MD [facility]

## 2019-09-02 NOTE — Patient Instructions (Addendum)
If you are age 73 or older, your body mass index should be between 23-30. Your Body mass index is 20.09 kg/m. If this is out of the aforementioned range listed, please consider follow up with your Primary Care Provider.  If you are age 51 or younger, your body mass index should be between 19-25. Your Body mass index is 20.09 kg/m. If this is out of the aformentioned range listed, please consider follow up with your Primary Care Provider.   Please go to the lab in the basement of our building to have lab work done as you leave today. Hit "B" for basement when you get on the elevator.  When the doors open the lab is on your left.  We will call you with the results. Thank you.    Thank you for entrusting me with your care and for choosing [facility], Dr. [place] Cellar

## 2019-09-05 LAB — QUANTIFERON-TB GOLD PLUS
Mitogen-NIL: >10 IU/mL
NIL: 0.03 IU/mL
QuantiFERON-TB Gold Plus: NEGATIVE
TB1-NIL: 0.02 IU/mL
TB2-NIL: 0 IU/mL

## 2019-09-05 LAB — HEPATITIS C ANTIBODY
Hepatitis C Ab: NONREACTIVE
SIGNAL TO CUT-OFF: 0.01 (ref <1.00)

## 2019-09-05 LAB — HEPATITIS B CORE ANTIBODY, TOTAL: Hep B Core Total Ab: NONREACTIVE

## 2019-09-05 LAB — HEPATITIS B SURFACE ANTIGEN: Hepatitis B Surface Ag: NONREACTIVE

## 2019-09-07 ENCOUNTER — Telehealth: Payer: Self-pay

## 2019-09-07 NOTE — Telephone Encounter (Signed)
-----   Message from Darden Dates sent at 09/07/2019  9:55 AM EST ----- Madaline Brilliant, so per [facility] this procedure would be covered: Facility copay [phone] Physician copay 0 There is no auth required for TIF cpt code 52479 Ref# [id number]  By the way, is Dr. Havery Moros going to be doing these? I know Dr. Bryan Lemma does, and they process benefits/auths in [facility] office.  Thanks, Amy ----- Message ----- From: Hughie Closs, RN Sent: 09/07/2019   9:27 AM EST To: Darden Dates  HI Amy, Sorry to ask you for more. This patient's 24-hr pH impedance test if to see if she is a candidate for the TIF procedure. Patient is asking what her cost for the TIF procedure would be. Could you help with this ? Thank-you so much [employer]

## 2019-09-07 NOTE — Telephone Encounter (Signed)
See below on TIF cost

## 2019-09-09 ENCOUNTER — Ambulatory Visit: Payer: Medicare HMO | Attending: Internal Medicine

## 2019-09-09 DIAGNOSIS — Z23 Encounter for immunization: Secondary | ICD-10-CM | POA: Insufficient documentation

## 2019-09-21 ENCOUNTER — Ambulatory Visit: Payer: Medicare HMO

## 2019-11-11 ENCOUNTER — Other Ambulatory Visit: Payer: Self-pay | Admitting: Endocrinology

## 2019-11-11 DIAGNOSIS — E890 Postprocedural hypothyroidism: Secondary | ICD-10-CM

## 2019-11-17 ENCOUNTER — Ambulatory Visit
Admission: RE | Admit: 2019-11-17 | Discharge: 2019-11-17 | Disposition: A | Discharge status: Home / Self Care | Payer: Medicare HMO | Source: Ambulatory Visit | Attending: Endocrinology | Admitting: Endocrinology

## 2019-11-17 DIAGNOSIS — E890 Postprocedural hypothyroidism: Secondary | ICD-10-CM

## 2020-02-13 ENCOUNTER — Encounter: Payer: Self-pay | Admitting: Physical Therapy

## 2020-02-13 ENCOUNTER — Ambulatory Visit: Payer: Medicare HMO | Attending: Family Medicine | Admitting: Physical Therapy

## 2020-02-13 ENCOUNTER — Other Ambulatory Visit: Payer: Self-pay

## 2020-02-13 DIAGNOSIS — M545 Low back pain, unspecified: Secondary | ICD-10-CM

## 2020-02-13 DIAGNOSIS — M6281 Muscle weakness (generalized): Secondary | ICD-10-CM | POA: Diagnosis present

## 2020-02-13 DIAGNOSIS — R252 Cramp and spasm: Secondary | ICD-10-CM | POA: Diagnosis present

## 2020-02-13 NOTE — Addendum Note (Signed)
Addended by: Dawayne Patricia on: 02/13/2020 11:53 AM   Modules accepted: Orders

## 2020-02-13 NOTE — Therapy (Signed)
[place], [place], [address] Phone: [phone]   Fax:  [phone]  Physical Therapy Evaluation  Patient Details  Name: Natalie Carroll MRN: [id number] Date of Birth: 21-Sep-1945 Referring Provider (PT): Briscoe   Encounter Date: 02/13/2020   PT End of Session - 02/13/20 1145    Visit Number 4    Date for PT Re-Evaluation 04/07/19    PT Start Time 1100    PT Stop Time 1135    PT Time Calculation (min) 35 min    Activity Tolerance Patient tolerated treatment well    Behavior During Therapy [facility] for tasks assessed/performed           Past Medical History:  Diagnosis Date  . Allergy   . Cancer [facility])    Skin cancer  . Colon polyp   . GERD (gastroesophageal reflux disease)   . Hyperlipidemia   . Hypothyroidism   . Ileitis   . Osteopenia   . Sleep apnea     Past Surgical History:  Procedure Laterality Date  . BREAST BIOPSY     left breast  . SKIN CANCER EXCISION    . THYROIDECTOMY, PARTIAL  1991  . TUBAL LIGATION      There were no vitals filed for this visit.    Subjective Assessment - 02/13/20 1101    Subjective Pt reports L LBP beginning about 2-3 months ago with no known MOI. Pt denies radiating pain in LLE. Pt reports that sitting, bending esp with twisting are aggravating factors. Pt states that walking helps LBP and standing does not bother pain.    Pertinent History osteopenia    Limitations Sitting;Lifting;House hold activities    How long can you sit comfortably? 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 Yes    Pain Score 3     Pain Location Back    Pain Orientation Left    Pain Descriptors / Indicators Dull;Sharp    Pain Type Acute pain    Pain Onset More than a month ago    Pain Frequency Intermittent    Aggravating Factors  bending, twisting,  lifting    Pain Relieving Factors walking, heat, hot shower, ibuprofen              [facility] PT Assessment - 02/13/20 0001      Assessment   Medical Diagnosis L LBP    Referring Provider (PT) Briscoe    Hand Dominance Right    Prior Therapy Yes      Precautions   Precautions None      Balance Screen   Has the patient fallen in the past 6 months No    Has the patient had a decrease in activity level because of a fear of falling?  No    Is the patient reluctant to leave their home because of a fear of falling?  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, maintenance / Strength   AROM / PROM / Strength AROM;Strength      AROM   Overall AROM Comments lumbar ROM [facility]  Strength   Overall Strength Comments 5/5 BLE except L hip ext 4/5      Flexibility   Soft Tissue Assessment /Muscle Length yes    Hamstrings tight    Quadriceps WFL    ITB WFL    Piriformis tight      Palpation   Palpation comment tender to palpation L lumbar paraspinals/glute/pifiormis      Special Tests    Special Tests Lumbar    Lumbar Tests FABER test      FABER test   findings Negative    Side LEft      Transfers   Five time sit to stand comments  WFL; functional hip weakness: knee adduction                      Objective measurements completed on examination: See above findings.       [place] Adult PT Treatment/Exercise - 02/13/20 0001      Exercises   Exercises Lumbar      Lumbar Exercises: Stretches   Active Hamstring Stretch Right;Left;1 rep;30 seconds    Prone on Elbows Stretch 5 reps;10 seconds    Other Lumbar Stretch Exercise sktc with piriformis stretch    Other Lumbar Stretch Exercise standing lumbar extension at counter                  PT Education - 02/13/20 1143    Education Details Pt educated on POC and  HEP    Person(s) Educated Patient    Methods Explanation;Demonstration;Handout    Comprehension Verbalized understanding;Returned demonstration            PT Short Term Goals - 02/13/20 1149      PT SHORT TERM GOAL #1   Title independent with initial HEP    Time 2    Period Weeks    Status New    Target Date 02/27/20             PT Long Term Goals - 02/13/20 1150      PT LONG TERM GOAL #1   Title Pt will report ability to perform all ADLs with no increase in LBP    Time 6    Period Weeks    Status New    Target Date 03/26/20      PT LONG TERM GOAL #2   Title decrease pain 50%    Time 6    Period Weeks    Status New    Target Date 03/26/20      PT LONG TERM GOAL #3   Title Pt will report ability to sit for >1 hour with no increase in LBP    Baseline 30 min    Time 6    Period Weeks    Status New    Target Date 03/26/20                  Plan - 02/13/20 1145    Clinical Impression Statement Pt presents to clinic with reports of L LBP worsening over the past 2-3 months; no known MOI. Pt denies radiating pain in LLE. Pt reports bending/sitting as exacerbating factors and walking as an alleviating factor. Pt demos LE flexibility deficits, tenderness to palpation in L glute/piriformis/lumbar paraspinals, and functional strength deficits in hip ext/abd. 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 Pt would benefit from skilled PT to address the above impairments.  Personal Factors and Comorbidities Comorbidity 2    Comorbidities osteopenia, Crohn's disease    Examination-Activity Limitations Bend;Sit    Examination-Participation Restrictions Community Activity;Interpersonal Relationship    Stability/Clinical Decision Making Stable/Uncomplicated    Clinical Decision Making Low    Rehab Potential Good    PT Frequency 1x / week    PT Duration 6 weeks    PT Treatment/Interventions ADLs/Self  Care Home Management;Electrical Stimulation;Moist Heat;Iontophoresis 40m/ml Dexamethasone;Gait training;Stair training;Therapeutic activities;Therapeutic exercise;Neuromuscular re-education;Manual techniques;Patient/family education;Functional mobility training;Passive range of motion;Dry needling    PT Next Visit Plan review and progress HEP (pt needs more home exercises d/t copay), LE strength/flexibility, lumbar stab, manual/modalities as indicated    PT Home Exercise Plan sktc with piriformis stretch, hamstring stretch, lumbar extension over counter, prone press up    Consulted and Agree with Plan of Care Patient           Patient will benefit from skilled therapeutic intervention in order to improve the following deficits and impairments:  Decreased range of motion, Decreased strength, Increased muscle spasms, Postural dysfunction, Improper body mechanics, Pain  Visit Diagnosis: Acute left-sided low back pain without sciatica  Muscle weakness (generalized)  Cramp and spasm     Problem List Patient Active Problem List   Diagnosis Date Noted  . Precordial chest pain 06/29/2019  . Educated about COVID-19 virus infection 06/29/2019  . GERD (gastroesophageal reflux disease) 07/31/2016  . Hemorrhoids 07/31/2016   AAmador Cunas PT, DPT ADonald ProseSugg 02/13/2020, 11:52 AM  [place] [place] [address]Phone: [phone]  Fax:  [phone] Name: CBrayah UrquillaMRN: [id number] of Birth: [birth year]

## 2020-02-13 NOTE — Patient Instructions (Signed)
Access Code: [email or web address] URL: [email or web address] Date: 02/13/2020 Prepared by: Amador Cunas  Exercises Standing Lumbar Extension with Counter - 1 x daily - 7 x weekly - 3 sets - 5 reps - 5 sec hold Seated Table Hamstring Stretch - 1 x daily - 7 x weekly - 3 sets - 3 reps - 20 sec hold Supine Piriformis Stretch with Leg Straight - 1 x daily - 7 x weekly - 3 sets - 3 reps - 20 sec hold Prone Press Up on Elbows - 1 x daily - 7 x weekly - 3 sets - 10 reps - 3 sec hold

## 2020-02-20 ENCOUNTER — Ambulatory Visit: Payer: Medicare HMO | Admitting: Physical Therapy

## 2020-02-20 ENCOUNTER — Encounter: Payer: Self-pay | Admitting: Physical Therapy

## 2020-02-20 ENCOUNTER — Other Ambulatory Visit: Payer: Self-pay

## 2020-02-20 DIAGNOSIS — M6281 Muscle weakness (generalized): Secondary | ICD-10-CM

## 2020-02-20 DIAGNOSIS — R252 Cramp and spasm: Secondary | ICD-10-CM

## 2020-02-20 DIAGNOSIS — M545 Low back pain, unspecified: Secondary | ICD-10-CM

## 2020-02-20 NOTE — Therapy (Signed)
[place], [place], [address] Phone: [phone]   Fax:  [phone]  Physical Therapy Treatment  Patient Details  Name: Natalie Carroll MRN: [id number] Date of Birth: Dec 20, 1945 Referring Provider (PT): Briscoe   Encounter Date: 02/20/2020   PT End of Session - 02/20/20 1507    Visit Number 5    Date for PT Re-Evaluation 04/07/19    PT Start Time 1400    PT Stop Time 1446    PT Time Calculation (min) 46 min    Activity Tolerance Patient tolerated treatment well    Behavior During Therapy [facility] for tasks assessed/performed           Past Medical History:  Diagnosis Date   Allergy    Cancer ([place])    Skin cancer   Colon polyp    GERD (gastroesophageal reflux disease)    Hyperlipidemia    Hypothyroidism    Ileitis    Osteopenia    Sleep apnea     Past Surgical History:  Procedure Laterality Date   BREAST BIOPSY     left breast   SKIN CANCER EXCISION     THYROIDECTOMY, PARTIAL  1991   TUBAL LIGATION      There were no vitals filed for this visit.   Subjective Assessment - 02/20/20 1404    Subjective Pt reports LB is feeling pretty good today; states small pain in R hip    Currently in Pain? Yes    Pain Score 1     Pain Location Back                             [facility] Adult PT Treatment/Exercise - 02/20/20 0001      Lumbar Exercises: Stretches   Gastroc Stretch Right;Left;1 rep;30 seconds      Lumbar Exercises: Aerobic   Nustep L5 x 7 min      Lumbar Exercises: Machines for Strengthening   Cybex Lumbar Extension 2x10 with black TB    Cybex Knee Extension 15# 2x10    Cybex Knee Flexion 25# 2x10    Other Lumbar Machine Exercise rows and lats 20# 2x10; shoulder ext 10# 2x10    Other Lumbar Machine Exercise resisted gait 30# x5 each direction      Lumbar Exercises: Standing   Other Standing Lumbar Exercises lumbar ext over exercise  ball with 5 sec hold x10; mini squats with exercise ball x10 5 sec hold    Other Standing Lumbar Exercises hip abd/ext with green TB around ankles 1x10 B      Lumbar Exercises: Seated   Other Seated Lumbar Exercises thoracic extension over chair x10                    PT Short Term Goals - 02/20/20 1509      PT SHORT TERM GOAL #1   Title independent with initial HEP    Status Achieved             PT Long Term Goals - 02/20/20 1509      PT LONG TERM GOAL #1   Title Pt will report ability to perform all ADLs with no increase in LBP    Time 6    Period Weeks    Status On-going      PT LONG TERM GOAL #2   Title decrease pain 50%    Time 6  Period Weeks    Status Achieved      PT LONG TERM GOAL #3   Title Pt will report ability to sit for >1 hour with no increase in LBP    Baseline 30 min    Time 6    Period Weeks    Status Partially Met                 Plan - 02/20/20 1507    Clinical Impression Statement Pt tolerated progression to TE well; no complaints of increased LBP with any exercise. Pt demos core weakness with cues to avoid compensations during standing shoulder extensions. Pt reports stretches for HEP are helping and states she has had no radiating pain since time of eval. CGA for resisted gait d/t occasional LOB esp when stepping to L.    PT Treatment/Interventions ADLs/Self Care Home Management;Electrical Stimulation;Moist Heat;Iontophoresis 48m/ml Dexamethasone;Gait training;Stair training;Therapeutic activities;Therapeutic exercise;Neuromuscular re-education;Manual techniques;Patient/family education;Functional mobility training;Passive range of motion;Dry needling    PT Next Visit Plan LE strength/flexibility, lumbar stab, manual/modalities as indicated    Consulted and Agree with Plan of Care Patient           Patient will benefit from skilled therapeutic intervention in order to improve the following deficits and impairments:   Decreased range of motion, Decreased strength, Increased muscle spasms, Postural dysfunction, Improper body mechanics, Pain  Visit Diagnosis: Acute left-sided low back pain without sciatica  Muscle weakness (generalized)  Cramp and spasm     Problem List Patient Active Problem List   Diagnosis Date Noted   Precordial chest pain 06/29/2019   Educated about COVID-19 virus infection 06/29/2019   GERD (gastroesophageal reflux disease) 07/31/2016   Hemorrhoids 07/31/2016   AAmador Cunas PT, DPT ADonald ProseSugg 02/20/2020, 3:10 PM  [place] [place] [address]Phone: [phone]  Fax:  [phone] Name: Natalie RelifordMRN: [id number] of Birth: 3Jul 26, 1947

## 2020-04-15 IMAGING — RF DG ESOPHAGUS
8 series · 8 of 8 positions shown · non-contrast
Comparison: None.

CLINICAL DATA: Gastroesophageal reflux.

EXAM:
ESOPHOGRAM
TECHNIQUE: Combined double contrast and single contrast examination performed
using effervescent crystals, thick barium liquid, and thin barium
liquid. The patient was observed with fluoroscopy swallowing a 13 mm
barium sulphate tablet.
FLUOROSCOPY TIME:  Fluoroscopy Time:  1 minutes 36 seconds
Radiation Exposure Index (if provided by the fluoroscopic device):
10.7 mGy
Number of Acquired Spot Images: 0

[Series 1: cp_standard · 0.28mm/px · 1 of 1 slices shown (1 of 8)]
[im 1/1]
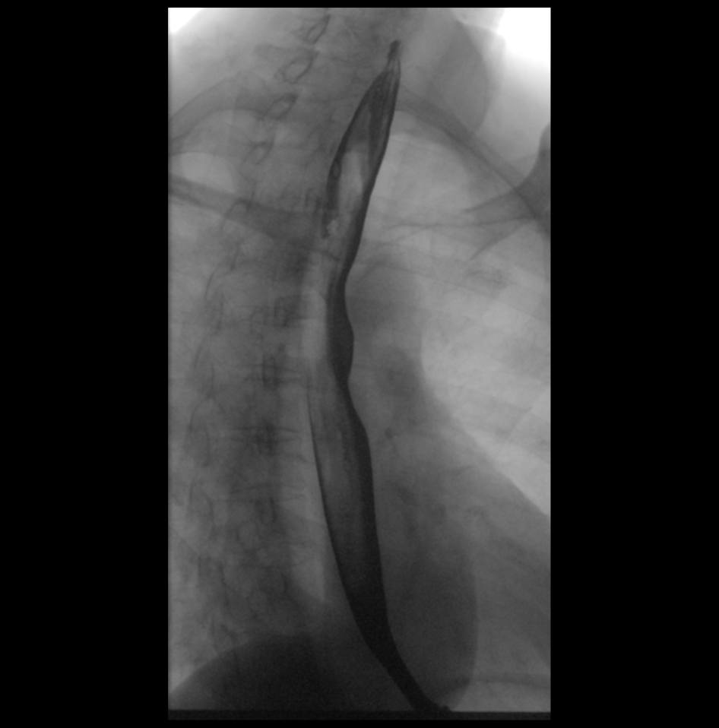

[Series 2: cp_standard · 0.28mm/px · 1 of 1 slices shown (2 of 8)]
[im 1/1]
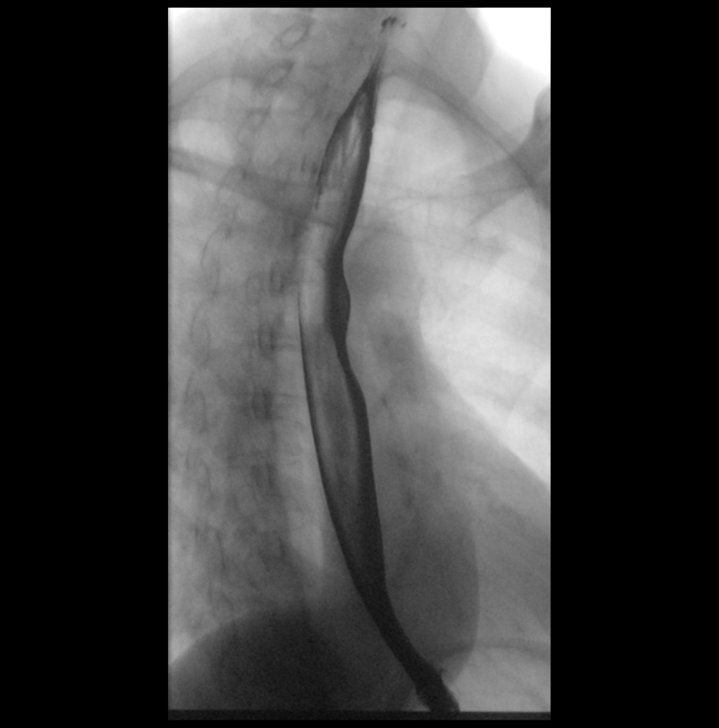

[Series 3: cp_standard · 0.28mm/px · 1 of 1 slices shown (3 of 8)]
[im 1/1]
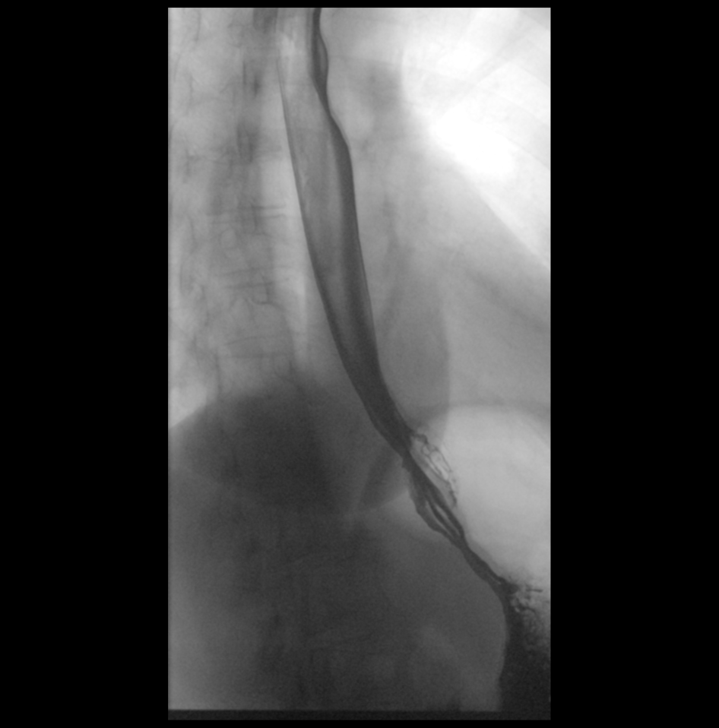

[Series 4: cp_standard · 0.28mm/px · 1 of 1 slices shown (4 of 8)]
[im 1/1]
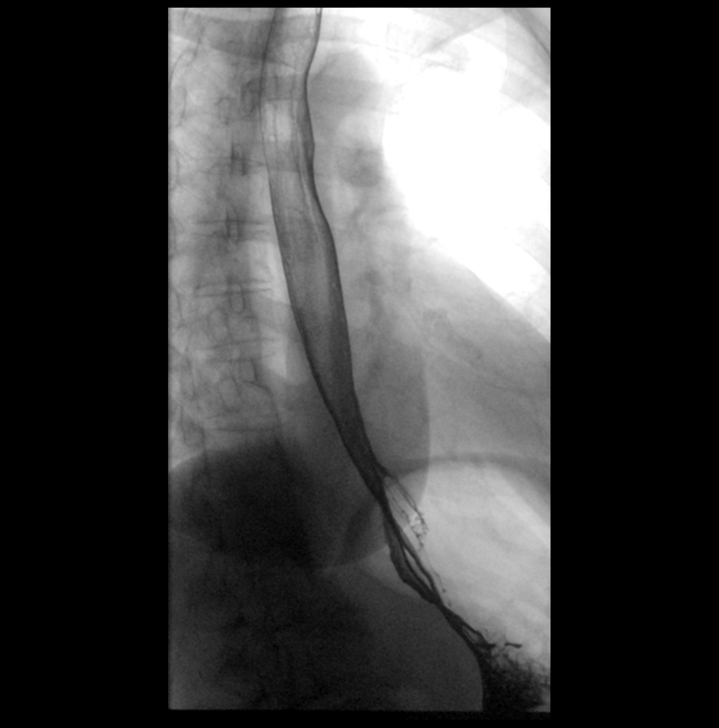

[Series 5: cp_standard · 0.28mm/px · 1 of 1 slices shown (5 of 8)]
[im 1/1]
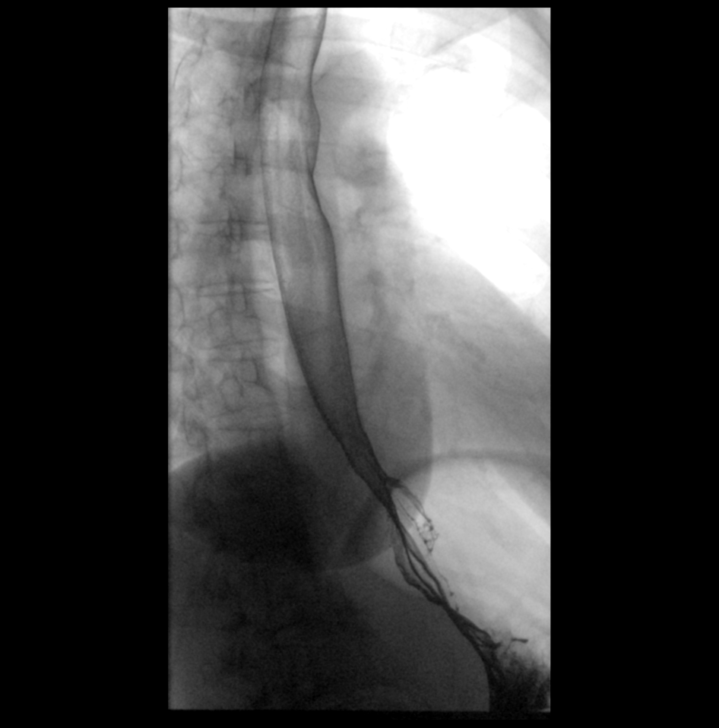

[Series 6: cp_standard · 0.30mm/px · 1 of 1 slices shown (6 of 8)]
[im 1/1]
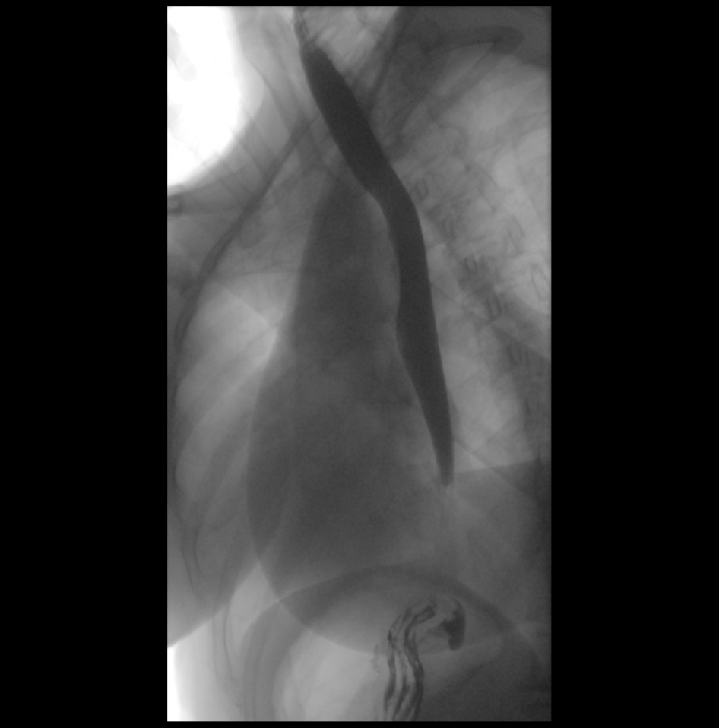

[Series 7: cp_standard · 0.30mm/px · 1 of 1 slices shown (7 of 8)]
[im 1/1]
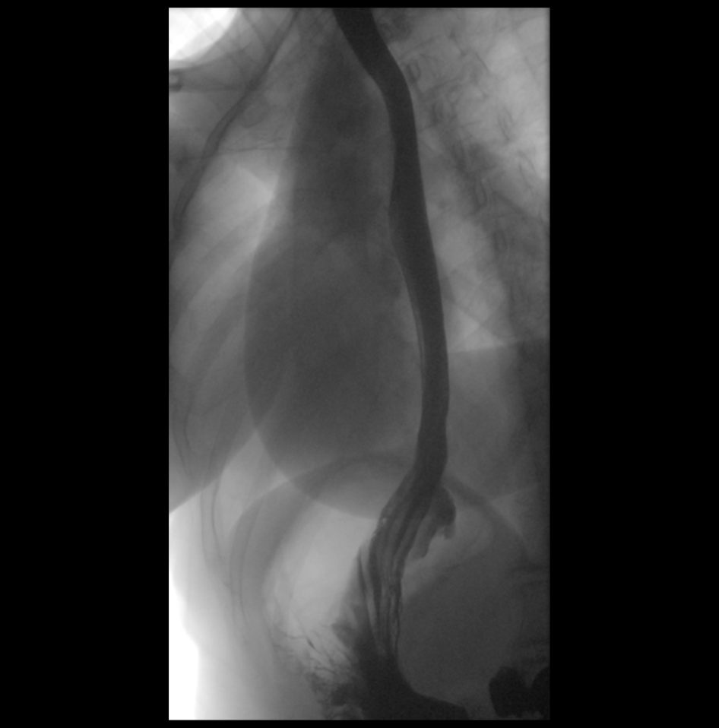

[Series 8: cp_standard · 0.30mm/px · 1 of 1 slices shown (8 of 8)]
[im 1/1]
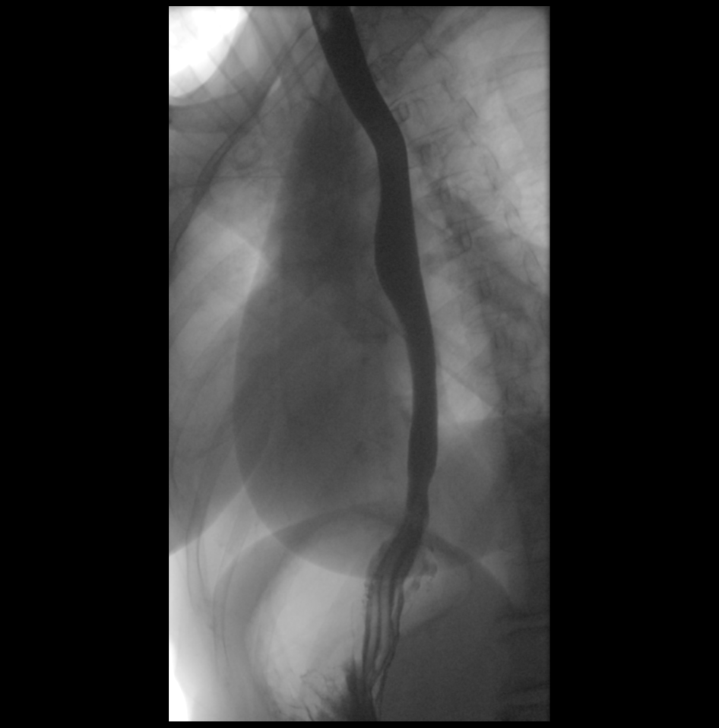

[8 of 8 positions shown; findings below may reference images not displayed]

FINDINGS: No evidence of esophageal mass or stricture. No findings of
esophagitis noted. Esophageal motility is within normal limits. No
evidence of hiatal hernia. No gastroesophageal reflux was seen
during the exam.
IMPRESSION: Negative esophagram. No evidence of hiatal hernia or esophageal
stricture.

## 2020-10-17 ENCOUNTER — Ambulatory Visit: Payer: Medicare HMO | Admitting: Allergy

## 2020-12-14 ENCOUNTER — Ambulatory Visit: Payer: Medicare HMO | Admitting: Allergy and Immunology

## 2020-12-21 ENCOUNTER — Ambulatory Visit: Payer: Medicare HMO | Admitting: Allergy and Immunology

## 2021-01-07 ENCOUNTER — Ambulatory Visit: Payer: Medicare HMO | Admitting: Allergy & Immunology

## 2021-02-04 ENCOUNTER — Other Ambulatory Visit: Payer: Self-pay

## 2021-02-04 ENCOUNTER — Ambulatory Visit: Payer: Medicare HMO | Admitting: Allergy & Immunology

## 2021-02-04 ENCOUNTER — Encounter: Payer: Self-pay | Admitting: Allergy & Immunology

## 2021-02-04 VITALS — BP 112/58 | HR 72 | Temp 98.0°F | Resp 16 | Ht 67.5 in | Wt 130.8 lb

## 2021-02-04 DIAGNOSIS — R21 Rash and other nonspecific skin eruption: Secondary | ICD-10-CM

## 2021-02-04 DIAGNOSIS — J310 Chronic rhinitis: Secondary | ICD-10-CM

## 2021-02-04 MED ORDER — TRIAMCINOLONE ACETONIDE 0.1 % EX OINT: 1.0000 "application " | TOPICAL_OINTMENT | Freq: Two times a day (BID) | CUTANEOUS | 3 refills | Status: DC

## 2021-02-04 NOTE — Patient Instructions (Addendum)
1. Rash - Testing was negative to the entire environmental panel as well as the most common food allergens. - Copy of testing results provided. - Start triamcinolone as needed at the first sign of the rash and make an appointment to come in. - I would love to see this rash in person. - We are not going to do more workup at this point in time, but we can do labs if needed if the rash continues.   2. Return in about 6 months (around 08/07/2021).    Please inform us of any Emergency Department visits, hospitalizations, or changes in symptoms. Call us before going to the ED for breathing or allergy symptoms since we might be able to fit you in for a sick visit. Feel free to contact us anytime with any questions, problems, or concerns.  It was a pleasure to meet you today!  Websites that have reliable patient information: 1. American Academy of Asthma, Allergy, and Immunology: www.aaaai.org 2. Food Allergy Research and Education (FARE): foodallergy.org 3. Mothers of Asthmatics: http://www.asthmacommunitynetwork.org 4. American College of Allergy, Asthma, and Immunology: www.acaai.org   COVID-19 Vaccine Information can be found at: ShippingScam.co.uk For questions related to vaccine distribution or appointments, please email [email or web address] or call [phone].   We realize that you might be concerned about having an allergic reaction to the COVID19 vaccines. To help with that concern, WE ARE OFFERING THE COVID19 VACCINES IN OUR OFFICE! Ask the front desk for dates!     "Like" [place] on Facebook and Instagram for our latest updates!      A healthy democracy works best when [employer] participate! Make sure you are registered to vote! If you have moved or changed any of your contact information, you will need to get this updated before voting!  In some cases, you MAY be able to register to vote online:  [email or web address]    1. Control-Buffer 50% Glycerol Negative   2. Control-Histamine 1 mg/ml 2+   3. Albumin saline Negative   4. [place] Negative   5. [place] Negative   6. Johnson Negative   7. Vaughn Blue Negative   8. Meadow Fescue Negative   9. Perennial Rye Negative   10. Sweet Vernal Negative   11. Timothy Negative   12. Cocklebur Negative   13. Burweed Marshelder Negative   14. Ragweed, short Negative   15. Ragweed, Giant Negative   16. Plantain,  English Negative   17. Lamb's Quarters Negative   18. Sheep Sorrell Negative   19. Rough Pigweed Negative   20. Marsh Elder, Rough Negative   21. Mugwort, Common Negative   22. Ash mix Negative   23. Birch mix Negative   24. Beech American Negative   25. Box, Elder Negative   26. Cedar, red Negative   27. Cottonwood, [place] Negative   28. Elm mix Negative   29. Hickory Negative   30. Maple mix Negative   31. Oak, [place] mix Negative   32. Pecan Pollen Negative   33. Pine mix Negative   34. Sycamore Eastern Negative   35. Lindsay, Black Pollen Negative   36. Alternaria alternata Negative   37. Cladosporium Herbarum Negative   38. Aspergillus mix Negative   39. Penicillium mix Negative   40. Bipolaris sorokiniana (Helminthosporium) Negative   41. Drechslera spicifera (Curvularia) Negative   42. Mucor plumbeus Negative   43. Fusarium moniliforme Negative   44. Aureobasidium pullulans (pullulara) Negative   45. Rhizopus oryzae Negative   46. Botrytis cinera  Negative   47. Epicoccum nigrum Negative   48. Phoma betae Negative   49. Candida Albicans Negative   50. Trichophyton mentagrophytes Negative   51. Mite, D Farinae  5,000 AU/ml Negative   52. Mite, D Pteronyssinus  5,000 AU/ml Negative   53. Cat Hair 10,000 BAU/ml Negative   54.  Dog Epithelia Negative   55. Mixed Feathers Negative   56. Horse Epithelia Negative   57. Cockroach, German Negative   58. Mouse Negative   59.  Tobacco Leaf Negative     1. Peanut Negative   2. Soybean food Negative   3. Wheat, whole Negative   4. Sesame Negative   5. Milk, cow Negative   6. Egg White, chicken Negative   7. Casein Negative   8. Shellfish mix Negative   9. Fish mix Negative   10. Cashew Negative

## 2021-02-04 NOTE — Progress Notes (Signed)
NEW PATIENT  Date of Service/Encounter:  02/04/21  Consult requested by: Katherina Mires, MD   Assessment:   Rash -  unclear etiology and no pictures today  Chronic rhinitis   Plan/Recommendations:   1. Rash - Testing was negative to the entire environmental panel as well as the most common food allergen s. - Copy of testing results provided. - Start triamcinolone as needed at the first sign of the rash and make an appointment to come in. - I would love to see this rash in person. - We are not going to do more workup at this point in time, but we can do labs if needed if the rash continues.   2. Chronic rhinitis - Testing was negative to the entire panel. - Continue with OTC antihistamines as needed.   3. Return in about 6 months (around 08/07/2021).   This note in its entirety was forwarded to the Provider who requested this consultation.  Subjective:   Natalie Carroll is a 75 y.o. female presenting today for evaluation of  Chief Complaint  Patient presents with   Allergy Testing    Breaking out in rash on arms and chest, cough with a itchy throat. Last rash was in late spring    Natalie Carroll has a history of the following: Patient Active Problem List   Diagnosis Date Noted   Precordial chest pain 06/29/2019   Educated about COVID-19 virus infection 06/29/2019   GERD (gastroesophageal reflux disease) 07/31/2016   Hemorrhoids 07/31/2016    History obtained from: chart review and patient.  Natalie Carroll was referred by Katherina Mires, MD.      Natalie Carroll is a 75 y.o. female presenting for an evaluation of a rash .  She had tried to get here in the spring time for itching and rashes. She is unsure what is triggering her reactions at all. She did go to see the Dermatologist and was diagnosed with dermatitis. She got some kind of cream to help with the itching. She is unsure of the name of the cream. This did help the itching and it finally went away. She has had this  previously but it was worse this year. She was cleaning her bedroom and her carpeting. She thought that it was dust mites. She may have tried an antihistamines but she was not sure if it was safe to take long term.  She is unsure whether this is every single year, but every few years it becomes worse. Overall it is easy for her to breakout. Everything seems to trigger it. She does not have issues in the winter; it is mostly the spring and early summer. She also gets them on her arms. She has some summertime itchy eyes.   She does not get pneumonia at all. But she does have some head aches and sinus pain fairly often. She does not remember the last time that she needed antibiotics. She will have some occasional allergic rhinitis symptoms which she treats with OTC medications. She was tested by someone in the [place] area, but again she does not remember the results. This was 15-20 years ago.   She does have a history of Crohn's disease. She is not taking anything right now. She has not had a flare in several years. She had COVId19 three weeks ago and had a Crohn's flare. She was on Paxlovid during this time and was stopped because of a side effect. She was told to take Mucinex.  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   Otherwise, there is no history of other atopic diseases, including asthma, food allergies, drug allergies, stinging insect allergies, or contact dermatitis. There is no significant infectious history. Vaccinations are up to date.    Past Medical History: Patient Active Problem List   Diagnosis Date Noted   Precordial chest pain 06/29/2019   Educated about COVID-19 virus infection 06/29/2019   GERD (gastroesophageal reflux disease) 07/31/2016   Hemorrhoids 07/31/2016    Medication List:  Allergies as of 02/04/2021   Not on File      Medication List        Accurate as of February 04, 2021  11:59 PM. If you have any questions, ask your nurse or doctor.          STOP taking these medications    calcium-vitamin D 250-100 MG-UNIT tablet Stopped by: Valentina Shaggy, MD   Gaviscon Extra Relief Formula [phone] MG/10ML Susp Generic drug: Alum Hydroxide-Mag Carbonate Stopped by: Valentina Shaggy, MD   OVER THE COUNTER MEDICATION Stopped by: Valentina Shaggy, MD   PROBIOTIC DAILY PO Stopped by: Valentina Shaggy, MD   TURMERIC PO Stopped by: Valentina Shaggy, MD   vitamin C 500 MG tablet Commonly known as: ASCORBIC ACID Stopped by: Valentina Shaggy, MD       TAKE these medications    acetaminophen 325 MG tablet Commonly known as: TYLENOL Take 650 mg by mouth every 6 (six) hours as needed.   cholecalciferol 25 MCG (1000 UNIT) tablet Commonly known as: VITAMIN D3 Take 1,000 Units by mouth daily.   clobetasol ointment 0.05 % Commonly known as: TEMOVATE   escitalopram 10 MG tablet Commonly known as: LEXAPRO Take 1 tablet by mouth daily.   estradiol 0.1 MG/GM vaginal cream Commonly known as: ESTRACE estradiol 0.01% (0.1 mg/gram) vaginal cream  INSERT 0.5 GRAM VAGINALLY 2 TIMES EVERY WEEK   levothyroxine 112 MCG tablet Commonly known as: SYNTHROID Take 1 tablet by mouth daily.   Magnesium Oxide 250 MG Tabs Take by mouth daily.   multivitamin tablet Take 1 tablet by mouth daily.   omeprazole 20 MG capsule Commonly known as: PRILOSEC Take one tablet (35m) by mouth once or twice daily   omeprazole 20 MG capsule Commonly known as: PRILOSEC Take 1 capsule by mouth daily.   OVER THE COUNTER MEDICATION Vitamin E, one capsule daily.   pravastatin 20 MG tablet Commonly known as: PRAVACHOL Take 20 mg by mouth daily.   triamcinolone ointment 0.1 % Commonly known as: KENALOG Apply 1 application topically 2 (two) times daily. Started by: JValentina Shaggy MD   vitamin B-12 1000 MCG tablet Commonly known as:  CYANOCOBALAMIN Take 1,000 mcg by mouth daily.        Birth History: non-contributory  Developmental History: non-contributory  Past Surgical History: Past Surgical History:  Procedure Laterality Date   BREAST BIOPSY     left breast   SKIN CANCER EXCISION     THYROIDECTOMY, PARTIAL  1991   TONSILLECTOMY     TUBAL LIGATION       Family History: Family History  Problem Relation Age of Onset   Diabetes Mother    Heart disease Mother 821      Stents   Ovarian cancer Sister    Kidney disease Sister    Bladder Cancer Sister    CVA Sister  Diabetes Brother    Heart disease Brother        Valve surgery   Colon cancer Neg Hx    Esophageal cancer Neg Hx    Rectal cancer Neg Hx    Stomach cancer Neg Hx    Allergic rhinitis Neg Hx    Asthma Neg Hx    Eczema Neg Hx    Urticaria Neg Hx      Social History: Natalie Carroll lives at home in a house that was built in 1966.  There is laminate in the main living areas and carpeting in the bedrooms.  He has gas heating and heat pump for cooling.  There are no animals inside or outside of the home.  She does not have dust mite covers on the bedding.  There is no tobacco exposure.  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  She is in the process of writing a book about her brother who died from HIV.  She is not exposed to fumes, chemicals, or dust.  She does not live near an interstate or industrial area.   ROS     Objective:   Blood pressure (!) 112/58, pulse 72, temperature 98 F (36.7 C), temperature source Temporal, resp. rate 16, height 5' 7.5" (1.715 m), weight 130 lb 12.8 oz (59.3 kg), SpO2 94 %. Body mass index is 20.18 kg/m.   Physical Exam:   Physical Exam Constitutional:      Appearance: She is well-developed.     Comments: Very talkative.   HENT:     Head: Normocephalic and atraumatic.     Right Ear: Tympanic membrane, ear canal and external ear normal. No drainage, swelling  or tenderness. Tympanic membrane is not injected, scarred, erythematous, retracted or bulging.     Left Ear: Tympanic membrane, ear canal and external ear normal. No drainage, swelling or tenderness. Tympanic membrane is not injected, scarred, erythematous, retracted or bulging.     Nose: No nasal deformity, septal deviation, mucosal edema or rhinorrhea.     Right Turbinates: Enlarged and swollen.     Left Turbinates: Enlarged and swollen.     Right Sinus: No maxillary sinus tenderness or frontal sinus tenderness.     Left Sinus: No maxillary sinus tenderness or frontal sinus tenderness.     Mouth/Throat:     Mouth: Mucous membranes are not pale and not dry.     Pharynx: Uvula midline.  Eyes:     General:        Right eye: No discharge.        Left eye: No discharge.     Conjunctiva/sclera: Conjunctivae normal.     Right eye: Right conjunctiva is not injected. No chemosis.    Left eye: Left conjunctiva is not injected. No chemosis.    Pupils: Pupils are equal, round, and reactive to light.  Cardiovascular:     Rate and Rhythm: Normal rate and regular rhythm.     Heart sounds: Normal heart sounds.  Pulmonary:     Effort: Pulmonary effort is normal. No tachypnea, accessory muscle usage or respiratory distress.     Breath sounds: Normal breath sounds. No wheezing, rhonchi or rales.     Comments: Moving air well in all lung fields. No increased work of breathing noted.  Chest:     Chest wall: No tenderness.  Abdominal:     Tenderness: There is no abdominal tenderness. There is no guarding or rebound.  Lymphadenopathy:  Head:     Right side of head: No submandibular, tonsillar or occipital adenopathy.     Left side of head: No submandibular, tonsillar or occipital adenopathy.     Cervical: No cervical adenopathy.  Skin:    General: Skin is warm.     Capillary Refill: Capillary refill takes less than 2 seconds.     Coloration: Skin is not pale.     Findings: No abrasion, erythema,  petechiae or rash. Rash is not papular, urticarial or vesicular.     Comments: No rash present at all.   Neurological:     Mental Status: She is alert.  Psychiatric:        Behavior: Behavior is cooperative.     Diagnostic studies:   Allergy Studies:     Airborne Adult Perc - 02/04/21 1449     Time Antigen Placed 0245    Allergen Manufacturer Lavella Hammock    Location Back    Number of Test 59    Panel 1 Select    1. Control-Buffer 50% Glycerol Negative    2. Control-Histamine 1 mg/ml 2+    3. Albumin saline Negative    4. Breckenridge Negative    5. [place] Negative    6. Johnson Negative    7. Silver Peak Blue Negative    8. Meadow Fescue Negative    9. Perennial Rye Negative    10. Sweet Vernal Negative    11. Timothy Negative    12. Cocklebur Negative    13. Burweed Marshelder Negative    14. Ragweed, short Negative    15. Ragweed, Giant Negative    16. Plantain,  English Negative    17. Lamb's Quarters Negative    18. Sheep Sorrell Negative    19. Rough Pigweed Negative    20. Marsh Elder, Rough Negative    21. Mugwort, Common Negative    22. Ash mix Negative    23. Birch mix Negative    24. Beech American Negative    25. Box, Elder Negative    26. Cedar, red Negative    27. Cottonwood, [place] Negative    28. Elm mix Negative    29. Hickory Negative    30. Maple mix Negative    31. Oak, [place] mix Negative    32. Pecan Pollen Negative    33. Pine mix Negative    34. Sycamore Eastern Negative    35. Candelero Abajo, Black Pollen Negative    36. Alternaria alternata Negative    37. Cladosporium Herbarum Negative    38. Aspergillus mix Negative    39. Penicillium mix Negative    40. Bipolaris sorokiniana (Helminthosporium) Negative    41. Drechslera spicifera (Curvularia) Negative    42. Mucor plumbeus Negative    43. Fusarium moniliforme Negative    44. Aureobasidium pullulans (pullulara) Negative    45. Rhizopus oryzae Negative    46. Botrytis cinera Negative    47.  Epicoccum nigrum Negative    48. Phoma betae Negative    49. Candida Albicans Negative    50. Trichophyton mentagrophytes Negative    51. Mite, D Farinae  5,000 AU/ml Negative    52. Mite, D Pteronyssinus  5,000 AU/ml Negative    53. Cat Hair 10,000 BAU/ml Negative    54.  Dog Epithelia Negative    55. Mixed Feathers Negative    56. Horse Epithelia Negative    57. Cockroach, German Negative    58. Mouse Negative    59. Tobacco Leaf  Negative             Food Perc - 02/04/21 1508       Test Information   Time Antigen Placed 1450    Location Back    Number of allergen test 10      Food   1. Peanut Negative    2. Soybean food Negative    3. Wheat, whole Negative    4. Sesame Negative    5. Milk, cow Negative    6. Egg White, chicken Negative    7. Casein Negative    8. Shellfish mix Negative    9. Fish mix Negative    10. Cashew Negative             Allergy testing results were read and interpreted by myself, documented by clinical staff.         Salvatore Marvel, MD [facility]

## 2021-02-05 ENCOUNTER — Encounter: Payer: Self-pay | Admitting: Allergy & Immunology

## 2021-09-09 ENCOUNTER — Ambulatory Visit: Payer: Medicare HMO | Attending: Nurse Practitioner | Admitting: Physical Therapy

## 2021-09-09 ENCOUNTER — Encounter: Payer: Self-pay | Admitting: Physical Therapy

## 2021-09-09 ENCOUNTER — Other Ambulatory Visit: Payer: Self-pay

## 2021-09-09 DIAGNOSIS — G8929 Other chronic pain: Secondary | ICD-10-CM | POA: Insufficient documentation

## 2021-09-09 DIAGNOSIS — M6281 Muscle weakness (generalized): Secondary | ICD-10-CM | POA: Diagnosis present

## 2021-09-09 DIAGNOSIS — M545 Low back pain, unspecified: Secondary | ICD-10-CM | POA: Diagnosis present

## 2021-09-09 DIAGNOSIS — R29898 Other symptoms and signs involving the musculoskeletal system: Secondary | ICD-10-CM | POA: Diagnosis present

## 2021-09-09 NOTE — Therapy (Signed)
[address] [place], [place], [address] Phone: [phone]   Fax:  [phone]  Physical Therapy Evaluation  Patient Details  Name: Natalie Carroll MRN: [id number] Date of Birth: 10/23/1945 Referring Provider (PT): Jake Bathe   Encounter Date: 09/09/2021   PT End of Session - 09/09/21 1018     Visit Number 1    Number of Visits 13    Date for PT Re-Evaluation 10/21/21    Authorization Type [employer]    Authorization Time Period 09/09/21 to 10/21/21    Progress Note Due on Visit 10    PT Start Time 0933    PT Stop Time 1013    PT Time Calculation (min) 40 min    Activity Tolerance Patient tolerated treatment well    Behavior During Therapy [facility] for tasks assessed/performed             Past Medical History:  Diagnosis Date   Allergy    Angio-edema    Cancer ([place])    Skin cancer   Colon polyp    GERD (gastroesophageal reflux disease)    Hyperlipidemia    Hypothyroidism    Ileitis    Osteopenia    Sleep apnea    Urticaria     Past Surgical History:  Procedure Laterality Date   BREAST BIOPSY     left breast   SKIN CANCER EXCISION     THYROIDECTOMY, PARTIAL  1991   TONSILLECTOMY     TUBAL LIGATION      There were no vitals filed for this visit.    Subjective Assessment - 09/09/21 0935     Subjective I'm having most pain around my tailbone on the right side; it only bothers me when I'm sitting. I had an injection in that area last Monday but I really haven't noticed that's its made a difference. Sitting in general makes it worse, I have to try to adjust so I'm not sitting straight down on it. I feel like I'm sitting on my bones. I've been losing weight too the older I get, I'm trying to gain more muscle weight back there. It feels like its crooked. No fall that started it, I noticed it about a year ago and its been getting worse all the time. I'm pretty active during the day doing things so  its hard for me to sit down after dinner, I have to lay on my side. No n/t, it feels like I'm sitting on a burning blister.    Patient Stated Goals get pain resolved and feel normal again    Currently in Pain? Yes    Pain Score 5    at worst can get 9/10 have to put ice on for 20 minutes if I'm still sitting, if I stand up it goes away immediately; at best 3/10 pain   Pain Location Coccyx    Pain Orientation Right    Pain Descriptors / Indicators Other (Comment);Burning   its like a burning blister   Pain Type Chronic pain    Pain Radiating Towards out into R hip    Pain Onset More than a month ago    Pain Frequency Constant    Aggravating Factors  sitting on that area    Pain Relieving Factors standing/getting pressure off area, ice    Effect of Pain on Daily Activities annoying                [facility] PT Assessment - 09/09/21  0001       Assessment   Medical Diagnosis back pain    Referring Provider (PT) Jake Bathe    Onset Date/Surgical Date --   about a year ago   Next MD Visit unsure    Prior Therapy PT here in the past for her back      Precautions   Precautions None      Restrictions   Weight Bearing Restrictions No      Balance Screen   Has the patient fallen in the past 6 months No    Has the patient had a decrease in activity level because of a fear of falling?  No    Is the patient reluctant to leave their home because of a fear of falling?  No      Prior Function   Level of Independence Independent;Independent with basic ADLs    Vocation Retired    Ecologist, painting      Observation/Other Assessments   Observations R LE significantly shorter than the L- approximately 3/4 to 1 inch difference   did not correct with functional bridge   Focus on Therapeutic Outcomes (FOTO)  69      ROM / Strength   AROM / PROM / Strength Strength;AROM      AROM   AROM Assessment Site Lumbar    Lumbar Flexion WNL    Lumbar Extension WNL    Lumbar  - Right Side Bend WNL    Lumbar - Left Side Bend WNL    Lumbar - Right Rotation WNL    Lumbar - Left Rotation WNL      Strength   Strength Assessment Site Hip;Knee    Right/Left Hip Right;Left    Right Hip Flexion 4/5    Right Hip Extension 3/5    Right Hip ABduction 4+/5    Left Hip Flexion 3+/5    Left Hip Extension 3/5    Left Hip ABduction 4/5    Right/Left Knee Right;Left    Right Knee Flexion 4+/5    Right Knee Extension 4+/5    Left Knee Flexion 4+/5    Left Knee Extension 4+/5      Flexibility   Hamstrings WNL    Quadriceps mild limitation    Piriformis WNL      Palpation   Spinal mobility lx PAs very tight but not painful, minimal spring    Palpation comment hamstrings not TTP even at proximal attachment point, no tenderness in glutes or piriformis, lx PAs stiff and lumbar paraspinals tight but not able to produce pain                        Objective measurements completed on examination: See above findings.       [facility] Adult PT Treatment/Exercise - 09/09/21 0001       Lumbar Exercises: Seated   Other Seated Lumbar Exercises MET for SI malalignment, able to correct 75%                     PT Education - 09/09/21 1018     Education Details exam findings, POC, HEP    Person(s) Educated Patient    Methods Explanation;Demonstration;Handout    Comprehension Verbalized understanding;Need further instruction;Returned demonstration              PT Short Term Goals - 09/09/21 1037       PT SHORT TERM GOAL #1   Title Will  be independent with appropriate progressive HEP    Time 3    Period Weeks    Status New    Target Date 09/30/21      PT SHORT TERM GOAL #2   Title Pain to be no more than 6/10 at worst, and will be no higher than 3/10 at best    Time 3    Period Weeks    Status New      PT SHORT TERM GOAL #3   Title Will be able to sleep for at least 4 hours without having to wake up to reposition due to pain    Time  3    Period Weeks    Status New      PT SHORT TERM GOAL #4   Title Will be able to sit for at least 20 continuous minutes without having to reposition with pain no more than 5/10    Time 3    Period Weeks    Status New               PT Long Term Goals - 09/09/21 1038       PT LONG TERM GOAL #1   Title MMT to improve by at least 1 grade in all weak groups to assist in reducing pain/improving function    Time 6    Period Weeks    Status New    Target Date 10/21/21      PT LONG TERM GOAL #2   Title Will be able to sit for at least 2 hours without having to reposition and with pain no more than 3/10 at worst    Time 6    Period Weeks    Status New      PT LONG TERM GOAL #3   Title Pain will be no more than 3/10 at worst with any functional activity    Time 6    Period Weeks    Status New      PT LONG TERM GOAL #4   Title Will be compliant with appropriate gym or group based exercise program to maintain functional gains and prevent recurrence of pain    Time 6    Period Weeks    Status New                    Plan - 09/09/21 1033     Clinical Impression Statement Natalie Carroll arrives today doing well, but frustrated with R tailbone pain that has been going on for a year; she tried seeing a chiropractor (45 visits) and got an injection in this area last week, but nothing has helped. Exam reveals great muscle flexibility, limited mobility in lumbar joints and SI region, functional muscle weakness, and significant LLD with L anterior rotation. Tried SI MET which corrected this problem by about 75% with immediate improvement in pain with midline sitting. I do think this SI rotation in combination with general proximal mm and core weakness is contributing to her pain- will benefit from skilled PT services in order to address pain and optimize QOL moving forward.    Personal Factors and Comorbidities Comorbidity 2;Age;Past/Current Experience;Time since onset of  injury/illness/exacerbation    Comorbidities osteopenia, Crohn's disease    Examination-Activity Limitations Sit;Sleep    Examination-Participation Restrictions Community Activity;Interpersonal Relationship;Church;Cleaning    Stability/Clinical Decision Making Stable/Uncomplicated    Clinical Decision Making Low    Rehab Potential Good    PT Frequency 2x / week    PT Duration  6 weeks    PT Treatment/Interventions ADLs/Self Care Home Management;Electrical Stimulation;Moist Heat;Iontophoresis 58m/ml Dexamethasone;Gait training;Stair training;Therapeutic activities;Therapeutic exercise;Neuromuscular re-education;Manual techniques;Patient/family education;Functional mobility training;Passive range of motion;Dry needling;Cryotherapy;Ultrasound;Balance training    PT Next Visit Plan check leg lengths, SI MET as needed; otherwise, general functional strength and conditioning. Add strength exercises to HEP    Consulted and Agree with Plan of Care Patient             Patient will benefit from skilled therapeutic intervention in order to improve the following deficits and impairments:  Decreased activity tolerance, Pain, Hypomobility, Decreased strength, Postural dysfunction  Visit Diagnosis: Chronic right-sided low back pain without sciatica  Other symptoms and signs involving the musculoskeletal system  Muscle weakness (generalized)     Problem List Patient Active Problem List   Diagnosis Date Noted   Precordial chest pain 06/29/2019   Educated about COVID-19 virus infection 06/29/2019   GERD (gastroesophageal reflux disease) 07/31/2016   Hemorrhoids 07/31/2016   KAnn LionsPT, DPT, PN2   Supplemental Physical Therapist [place] [address]Phone: [phone]  Fax:  [phone] Name: Natalie KAYESMRN: [id number] of Birth: [birth year]

## 2021-09-09 NOTE — Addendum Note (Signed)
Addended by: Hunt Oris on: 09/09/2021 03:52 PM   Modules accepted: Orders

## 2021-09-18 ENCOUNTER — Ambulatory Visit: Payer: Medicare HMO | Admitting: Physical Therapy

## 2021-09-18 ENCOUNTER — Encounter: Payer: Self-pay | Admitting: Physical Therapy

## 2021-09-18 ENCOUNTER — Other Ambulatory Visit: Payer: Self-pay

## 2021-09-18 DIAGNOSIS — M545 Low back pain, unspecified: Secondary | ICD-10-CM

## 2021-09-18 DIAGNOSIS — R29898 Other symptoms and signs involving the musculoskeletal system: Secondary | ICD-10-CM

## 2021-09-18 DIAGNOSIS — G8929 Other chronic pain: Secondary | ICD-10-CM

## 2021-09-18 DIAGNOSIS — M6281 Muscle weakness (generalized): Secondary | ICD-10-CM

## 2021-09-18 NOTE — Therapy (Signed)
[place]. [place], [place], [address] Phone: [phone]   Fax:  [phone]  Physical Therapy Treatment  Patient Details  Name: Natalie Carroll MRN: [id number] Date of Birth: 09/30/1945 Referring Provider (PT): Jake Bathe   Encounter Date: 09/18/2021   PT End of Session - 09/18/21 1207     Visit Number 2    Number of Visits 13    Date for PT Re-Evaluation 10/21/21    Authorization Type [employer]    Authorization Time Period 09/09/21 to 10/21/21    Progress Note Due on Visit 10    PT Start Time 1104    PT Stop Time 1143    PT Time Calculation (min) 39 min    Activity Tolerance Patient tolerated treatment well    Behavior During Therapy [facility] for tasks assessed/performed             Past Medical History:  Diagnosis Date   Allergy    Angio-edema    Cancer ([employer])    Skin cancer   Colon polyp    GERD (gastroesophageal reflux disease)    Hyperlipidemia    Hypothyroidism    Ileitis    Osteopenia    Sleep apnea    Urticaria     Past Surgical History:  Procedure Laterality Date   BREAST BIOPSY     left breast   SKIN CANCER EXCISION     THYROIDECTOMY, PARTIAL  1991   TONSILLECTOMY     TUBAL LIGATION      There were no vitals filed for this visit.   Subjective Assessment - 09/18/21 1105     Subjective I was doing OK with the exercises for awhile, then I started having more issues with strength to perform this exercise then pain followed. No other changes in terms of strength but I've noticed some more cramping in my feet my toes went under for the first time last night.    Patient Stated Goals get pain resolved and feel normal again    Currently in Pain? Yes    Pain Score 6     Pain Location Coccyx    Pain Orientation Right    Pain Descriptors / Indicators Burning    Pain Type Chronic pain                [facility] PT Assessment - 09/18/21 0001       Observation/Other Assessments    Observations RLE still shorter than L, about 1/2-3/4 inch                           [facility] Adult PT Treatment/Exercise - 09/18/21 0001       Exercises   Exercises Lumbar      Lumbar Exercises: Aerobic   Nustep BLEs L4 x 6 minutes      Lumbar Exercises: Supine   Ab Set 10 reps;3 seconds    Clam 10 reps;3 seconds    Clam Limitations red TB above knees, posterior pelvic tilt    Bent Knee Raise 15 reps    Bent Knee Raise Limitations TA set    Dead Bug 15 reps    Dead Bug Limitations TA set    Bridge 3 seconds;15 reps    Bridge Limitations slow controlled lower    Other Supine Lumbar Exercises bridge with clam shell 1x10 red TB above knees    Other Supine Lumbar Exercises double bent leg lift 1x10  with TA set      Lumbar Exercises: Quadruped   Madcat/Old Horse 10 reps    Other Quadruped Lumbar Exercises quadruped TA sets x10 3 second holds      Manual Therapy   Manual Therapy Muscle Energy Technique    Muscle Energy Technique SI MET for LLD                     PT Education - 09/18/21 1206     Education Details alternate method for self SI MET, exercise form and purpose    Person(s) Educated Patient    Methods Explanation    Comprehension Verbalized understanding              PT Short Term Goals - 09/09/21 1037       PT SHORT TERM GOAL #1   Title Will be independent with appropriate progressive HEP    Time 3    Period Weeks    Status New    Target Date 09/30/21      PT SHORT TERM GOAL #2   Title Pain to be no more than 6/10 at worst, and will be no higher than 3/10 at best    Time 3    Period Weeks    Status New      PT SHORT TERM GOAL #3   Title Will be able to sleep for at least 4 hours without having to wake up to reposition due to pain    Time 3    Period Weeks    Status New      PT SHORT TERM GOAL #4   Title Will be able to sit for at least 20 continuous minutes without having to reposition with pain no more than 5/10     Time 3    Period Weeks    Status New               PT Long Term Goals - 09/09/21 1038       PT LONG TERM GOAL #1   Title MMT to improve by at least 1 grade in all weak groups to assist in reducing pain/improving function    Time 6    Period Weeks    Status New    Target Date 10/21/21      PT LONG TERM GOAL #2   Title Will be able to sit for at least 2 hours without having to reposition and with pain no more than 3/10 at worst    Time 6    Period Weeks    Status New      PT LONG TERM GOAL #3   Title Pain will be no more than 3/10 at worst with any functional activity    Time 6    Period Weeks    Status New      PT LONG TERM GOAL #4   Title Will be compliant with appropriate gym or group based exercise program to maintain functional gains and prevent recurrence of pain    Time 6    Period Weeks    Status New                   Plan - 09/18/21 1207     Clinical Impression Statement Natalie Carroll arrives today doing OK, started to have some increased pain- she showed me how she is doing SI MET at home, this was not quite right. I corrected this and also showed her an alternate method that  may be easier for her to perform. She did need another round of SI MET for LLD (L LE still longer but to a lesser extent as compared to eval), otherwise focused on functional strengthening and core activation. Very easily fatigued, I do think that her gross weakness is contributing to her pain and allowing SI to slip out of alignment. Will continue to progress as able and tolerated.    Personal Factors and Comorbidities Comorbidity 2;Age;Past/Current Experience;Time since onset of injury/illness/exacerbation    Comorbidities osteopenia, Crohn's disease    Examination-Activity Limitations Sit;Sleep    Examination-Participation Restrictions Community Activity;Interpersonal Relationship;Church;Cleaning    Stability/Clinical Decision Making Stable/Uncomplicated    Clinical Decision  Making Low    Rehab Potential Good    PT Frequency 2x / week    PT Duration 6 weeks    PT Treatment/Interventions ADLs/Self Care Home Management;Electrical Stimulation;Moist Heat;Iontophoresis 22m/ml Dexamethasone;Gait training;Stair training;Therapeutic activities;Therapeutic exercise;Neuromuscular re-education;Manual techniques;Patient/family education;Functional mobility training;Passive range of motion;Dry needling;Cryotherapy;Ultrasound;Balance training    PT Next Visit Plan check leg lengths, SI MET as needed; otherwise, general functional strength and conditioning. Add strength exercises to HEP    Consulted and Agree with Plan of Care Patient             Patient will benefit from skilled therapeutic intervention in order to improve the following deficits and impairments:  Decreased activity tolerance, Pain, Hypomobility, Decreased strength, Postural dysfunction  Visit Diagnosis: Chronic right-sided low back pain without sciatica  Other symptoms and signs involving the musculoskeletal system  Muscle weakness (generalized)     Problem List Patient Active Problem List   Diagnosis Date Noted   Precordial chest pain 06/29/2019   Educated about COVID-19 virus infection 06/29/2019   GERD (gastroesophageal reflux disease) 07/31/2016   Hemorrhoids 07/31/2016   KAnn LionsPT, DPT, PN2   Supplemental Physical Therapist [place] [address]Phone: [phone]  Fax:  [phone] Name: Natalie MEDERMRN: [id number] of Birth: [birth year]

## 2021-09-20 ENCOUNTER — Ambulatory Visit: Payer: Medicare HMO | Admitting: Physical Therapy

## 2021-09-20 ENCOUNTER — Encounter: Payer: Self-pay | Admitting: Physical Therapy

## 2021-09-20 ENCOUNTER — Other Ambulatory Visit: Payer: Self-pay

## 2021-09-20 DIAGNOSIS — M545 Low back pain, unspecified: Secondary | ICD-10-CM | POA: Diagnosis not present

## 2021-09-20 DIAGNOSIS — R29898 Other symptoms and signs involving the musculoskeletal system: Secondary | ICD-10-CM

## 2021-09-20 DIAGNOSIS — M6281 Muscle weakness (generalized): Secondary | ICD-10-CM

## 2021-09-20 DIAGNOSIS — G8929 Other chronic pain: Secondary | ICD-10-CM

## 2021-09-20 NOTE — Therapy (Signed)
[place]. [place], [place], [address] Phone: [phone]   Fax:  [phone]  Physical Therapy Treatment  Patient Details  Name: Natalie Carroll MRN: [id number] Date of Birth: 01-18-1946 Referring Provider (PT): Jake Bathe   Encounter Date: 09/20/2021   PT End of Session - 09/20/21 1059     Visit Number 3    Number of Visits 13    Date for PT Re-Evaluation 10/21/21    Authorization Type [employer]    Authorization Time Period 09/09/21 to 10/21/21    Progress Note Due on Visit 10    PT Start Time 1016    PT Stop Time 1057    PT Time Calculation (min) 41 min    Activity Tolerance Patient tolerated treatment well    Behavior During Therapy [facility] for tasks assessed/performed             Past Medical History:  Diagnosis Date   Allergy    Angio-edema    Cancer ([place])    Skin cancer   Colon polyp    GERD (gastroesophageal reflux disease)    Hyperlipidemia    Hypothyroidism    Ileitis    Osteopenia    Sleep apnea    Urticaria     Past Surgical History:  Procedure Laterality Date   BREAST BIOPSY     left breast   SKIN CANCER EXCISION     THYROIDECTOMY, PARTIAL  1991   TONSILLECTOMY     TUBAL LIGATION      There were no vitals filed for this visit.   Subjective Assessment - 09/20/21 1016     Subjective My right hip and the right side of my back is hurting; [facility], I used the [employer] they had there . I felt good after what we did the other day. Back started feeling worse last night    Patient Stated Goals get pain resolved and feel normal again    Currently in Pain? Yes    Pain Score 5     Pain Location Other (Comment)   back and R hip   Pain Orientation Right    Pain Descriptors / Indicators Discomfort    Pain Type Chronic pain    Pain Onset More than a month ago    Pain Frequency Constant    Aggravating Factors  unsure    Pain Relieving Factors being  up and about                               [facility] Adult PT Treatment/Exercise - 09/20/21 0001       Lumbar Exercises: Stretches   Double Knee to Chest Stretch 30 seconds;2 reps    Piriformis Stretch Right;2 reps;30 seconds    Piriformis Stretch Limitations figure 4 stretch on table    Other Lumbar Stretch Exercise seated QL stretch with stool 2x30 seconds      Lumbar Exercises: Aerobic   Nustep BLEs L4 x 6 minutes      Lumbar Exercises: Standing   Other Standing Lumbar Exercises forward lunges with weighted yellow ball 1x10 each LE 4 inch step      Lumbar Exercises: Supine   Ab Set 15 reps;3 seconds    Bent Knee Raise 10 reps    Bent Knee Raise Limitations TA set    Dead Bug 10 reps    Dead Bug  Limitations with straight arm/leg, posterior pelvic tilt    Bridge 15 reps;3 seconds    Bridge Limitations red TB around knees    Other Supine Lumbar Exercises posterior pelvic tilts 1x15 with 3 second holds; posterior pelvic tilt with LE push out hovers 1x10      Lumbar Exercises: Sidelying   Clam Right;Left;10 reps    Clam Limitations red TB above knees                     PT Education - 09/20/21 1059     Education Details HEP updates, DOMS management and presentation    Person(s) Educated Patient    Methods Explanation    Comprehension Verbalized understanding              PT Short Term Goals - 09/09/21 1037       PT SHORT TERM GOAL #1   Title Will be independent with appropriate progressive HEP    Time 3    Period Weeks    Status New    Target Date 09/30/21      PT SHORT TERM GOAL #2   Title Pain to be no more than 6/10 at worst, and will be no higher than 3/10 at best    Time 3    Period Weeks    Status New      PT SHORT TERM GOAL #3   Title Will be able to sleep for at least 4 hours without having to wake up to reposition due to pain    Time 3    Period Weeks    Status New      PT SHORT TERM GOAL #4   Title Will be able  to sit for at least 20 continuous minutes without having to reposition with pain no more than 5/10    Time 3    Period Weeks    Status New               PT Long Term Goals - 09/09/21 1038       PT LONG TERM GOAL #1   Title MMT to improve by at least 1 grade in all weak groups to assist in reducing pain/improving function    Time 6    Period Weeks    Status New    Target Date 10/21/21      PT LONG TERM GOAL #2   Title Will be able to sit for at least 2 hours without having to reposition and with pain no more than 3/10 at worst    Time 6    Period Weeks    Status New      PT LONG TERM GOAL #3   Title Pain will be no more than 3/10 at worst with any functional activity    Time 6    Period Weeks    Status New      PT LONG TERM GOAL #4   Title Will be compliant with appropriate gym or group based exercise program to maintain functional gains and prevent recurrence of pain    Time 6    Period Weeks    Status New                   Plan - 09/20/21 1100     Clinical Impression [place] arrives today with a bit more pain in her R lower back and hip, unsure what caused this but it does get better with activity- might possibly  be some DOMS from last PT workout and her going to the gym yesterday. Checked SI alignment, it was actually WNL today with no significant LLD noted! Focused on core and proximal hip strengthening today- really needed a lot of heavy multimodal cues for good form today, fairly uncoordinated with exercises especially with novel tasks. I did give her some she is able to perform correctly with minimal cuing to help build core strength at home. Will progress as able and tolerated.    Personal Factors and Comorbidities Comorbidity 2;Age;Past/Current Experience;Time since onset of injury/illness/exacerbation    Comorbidities osteopenia, Crohn's disease    Examination-Activity Limitations Sit;Sleep    Examination-Participation Restrictions Community  Activity;Interpersonal Relationship;Church;Cleaning    Stability/Clinical Decision Making Stable/Uncomplicated    Clinical Decision Making Low    Rehab Potential Good    PT Frequency 2x / week    PT Duration 6 weeks    PT Treatment/Interventions ADLs/Self Care Home Management;Electrical Stimulation;Moist Heat;Iontophoresis 50m/ml Dexamethasone;Gait training;Stair training;Therapeutic activities;Therapeutic exercise;Neuromuscular re-education;Manual techniques;Patient/family education;Functional mobility training;Passive range of motion;Dry needling;Cryotherapy;Ultrasound;Balance training    PT Next Visit Plan check leg lengths, SI MET as needed; otherwise, general functional strength and conditioning.    Consulted and Agree with Plan of Care Patient             Patient will benefit from skilled therapeutic intervention in order to improve the following deficits and impairments:  Decreased activity tolerance, Pain, Hypomobility, Decreased strength, Postural dysfunction  Visit Diagnosis: Chronic right-sided low back pain without sciatica  Other symptoms and signs involving the musculoskeletal system  Muscle weakness (generalized)     Problem List Patient Active Problem List   Diagnosis Date Noted   Precordial chest pain 06/29/2019   Educated about COVID-19 virus infection 06/29/2019   GERD (gastroesophageal reflux disease) 07/31/2016   Hemorrhoids 07/31/2016   KAnn LionsPT, DPT, PN2   Supplemental Physical Therapist [place] [address]Phone: [phone]  Fax:  [phone] Name: Natalie HAILEMRN: [id number] of Birth: 312-Nov-1947

## 2021-09-23 ENCOUNTER — Other Ambulatory Visit: Payer: Self-pay

## 2021-09-23 ENCOUNTER — Encounter: Payer: Self-pay | Admitting: Physical Therapy

## 2021-09-23 ENCOUNTER — Ambulatory Visit: Payer: Medicare HMO | Admitting: Physical Therapy

## 2021-09-23 DIAGNOSIS — R29898 Other symptoms and signs involving the musculoskeletal system: Secondary | ICD-10-CM

## 2021-09-23 DIAGNOSIS — M545 Low back pain, unspecified: Secondary | ICD-10-CM | POA: Diagnosis not present

## 2021-09-23 DIAGNOSIS — M6281 Muscle weakness (generalized): Secondary | ICD-10-CM

## 2021-09-23 NOTE — Therapy (Signed)
[place]. [place], [place], [address] Phone: [phone]   Fax:  [phone]  Physical Therapy Treatment  Patient Details  Name: Natalie Carroll MRN: [id number] Date of Birth: [birth year] Referring Provider (PT): Jake Bathe   Encounter Date: 09/23/2021   PT End of Session - 09/23/21 1149     Visit Number 4    Number of Visits 13    Date for PT Re-Evaluation 10/21/21    Authorization Type [employer]    Authorization Time Period 09/09/21 to 10/21/21    Progress Note Due on Visit 10    PT Start Time 1104    PT Stop Time 1142    PT Time Calculation (min) 38 min    Activity Tolerance Patient tolerated treatment well    Behavior During Therapy [facility] for tasks assessed/performed             Past Medical History:  Diagnosis Date   Allergy    Angio-edema    Cancer ([place])    Skin cancer   Colon polyp    GERD (gastroesophageal reflux disease)    Hyperlipidemia    Hypothyroidism    Ileitis    Osteopenia    Sleep apnea    Urticaria     Past Surgical History:  Procedure Laterality Date   BREAST BIOPSY     left breast   SKIN CANCER EXCISION     THYROIDECTOMY, PARTIAL  1991   TONSILLECTOMY     TUBAL LIGATION      There were no vitals filed for this visit.   Subjective Assessment - 09/23/21 1106     Subjective I'm feeling OK today, still having pain. Did yoga yesterday but I didn't realize it was yoga 2 and I couldn't do all the moves. Just frustrated by pain. I've been using an elastic band on my stomach and its been helping.    Patient Stated Goals get pain resolved and feel normal again    Currently in Pain? Yes    Pain Score 6     Pain Location Other (Comment)   back and R hip   Pain Orientation Right    Pain Descriptors / Indicators Burning    Pain Type Chronic pain                               [facility] Adult PT Treatment/Exercise - 09/23/21 0001       Lumbar  Exercises: Aerobic   Nustep BLEs L5x5 minute      Lumbar Exercises: Standing   Wall Slides 10 reps;3 seconds    Wall Slides Limitations wall squat with 3 second hold 1/2 depth    Other Standing Lumbar Exercises hip hikes 1x10 B    Other Standing Lumbar Exercises step up on 4 inch step with march and TA set 1x10 B      Lumbar Exercises: Supine   Ab Set 15 reps;5 seconds    Bridge 20 reps;3 seconds    Bridge Limitations red TB around knees    Other Supine Lumbar Exercises posterior pelvic tilts 1x15 with 5 second holds; posterior pelvic tilt with LE push out hovers 1x10    Other Supine Lumbar Exercises double bent leg lift 1x15 with TA set      Lumbar Exercises: Sidelying   Clam Right;Left;15 reps    Clam Limitations red TB above knees      Lumbar Exercises:  Prone   Straight Leg Raise 10 reps      Manual Therapy   Manual Therapy Muscle Energy Technique    Muscle Energy Technique SI MET for LLD                     PT Education - 09/23/21 1148     Education Details focus more on machine level exercise at the gym- Nustep, recumbent bike, weight machines, etc, instead of free form classes to help avoiding aggravating pain    Person(s) Educated Patient    Methods Explanation    Comprehension Verbalized understanding              PT Short Term Goals - 09/09/21 1037       PT SHORT TERM GOAL #1   Title Will be independent with appropriate progressive HEP    Time 3    Period Weeks    Status New    Target Date 09/30/21      PT SHORT TERM GOAL #2   Title Pain to be no more than 6/10 at worst, and will be no higher than 3/10 at best    Time 3    Period Weeks    Status New      PT SHORT TERM GOAL #3   Title Will be able to sleep for at least 4 hours without having to wake up to reposition due to pain    Time 3    Period Weeks    Status New      PT SHORT TERM GOAL #4   Title Will be able to sit for at least 20 continuous minutes without having to reposition  with pain no more than 5/10    Time 3    Period Weeks    Status New               PT Long Term Goals - 09/09/21 1038       PT LONG TERM GOAL #1   Title MMT to improve by at least 1 grade in all weak groups to assist in reducing pain/improving function    Time 6    Period Weeks    Status New    Target Date 10/21/21      PT LONG TERM GOAL #2   Title Will be able to sit for at least 2 hours without having to reposition and with pain no more than 3/10 at worst    Time 6    Period Weeks    Status New      PT LONG TERM GOAL #3   Title Pain will be no more than 3/10 at worst with any functional activity    Time 6    Period Weeks    Status New      PT LONG TERM GOAL #4   Title Will be compliant with appropriate gym or group based exercise program to maintain functional gains and prevent recurrence of pain    Time 6    Period Weeks    Status New                   Plan - 09/23/21 1150     Clinical Impression Statement Natalie Carroll arrives this morning with ongoing pain, tried some advanced yoga the other day and it was very difficult for her. Just getting frustrated with pain. Was in a bit of a mis-alignment today (only about one quarter to one third of an inch) and we  were able to easily correct this and reduce pain with SI MET. Otherwise continued focusing on core and hip strength as I think weakness and deconditioning may be some of the cores of her problem. Also has a bit of trouble with general kinesthetic sense, I cant help but wonder if she is actually aggravating her pain when going to the gym/using poor form, encouraged her to focus on machine level activities which promote better control and form than free form exercises. Will continue efforts.    Personal Factors and Comorbidities Comorbidity 2;Age;Past/Current Experience;Time since onset of injury/illness/exacerbation    Comorbidities osteopenia, Crohn's disease    Examination-Activity Limitations Sit;Sleep     Examination-Participation Restrictions Community Activity;Interpersonal Relationship;Church;Cleaning    Stability/Clinical Decision Making Stable/Uncomplicated    Clinical Decision Making Low    Rehab Potential Good    PT Frequency 2x / week    PT Duration 6 weeks    PT Treatment/Interventions ADLs/Self Care Home Management;Electrical Stimulation;Moist Heat;Iontophoresis 71m/ml Dexamethasone;Gait training;Stair training;Therapeutic activities;Therapeutic exercise;Neuromuscular re-education;Manual techniques;Patient/family education;Functional mobility training;Passive range of motion;Dry needling;Cryotherapy;Ultrasound;Balance training    PT Next Visit Plan check leg lengths, SI MET as needed; otherwise, general functional strength and conditioning.    Consulted and Agree with Plan of Care Patient             Patient will benefit from skilled therapeutic intervention in order to improve the following deficits and impairments:  Decreased activity tolerance, Pain, Hypomobility, Decreased strength, Postural dysfunction  Visit Diagnosis: Chronic right-sided low back pain without sciatica  Other symptoms and signs involving the musculoskeletal system  Muscle weakness (generalized)     Problem List Patient Active Problem List   Diagnosis Date Noted   Precordial chest pain 06/29/2019   Educated about COVID-19 virus infection 06/29/2019   GERD (gastroesophageal reflux disease) 07/31/2016   Hemorrhoids 07/31/2016   KAnn LionsPT, DPT, PN2   Supplemental Physical Therapist [place] [address]Phone: [phone]  Fax:  [phone] Name: Natalie GRAVESMRN: [id number] of Birth: 3October 20, 1947

## 2021-09-25 ENCOUNTER — Ambulatory Visit: Payer: Medicare HMO | Attending: Nurse Practitioner | Admitting: Physical Therapy

## 2021-09-25 ENCOUNTER — Encounter: Payer: Self-pay | Admitting: Physical Therapy

## 2021-09-25 ENCOUNTER — Other Ambulatory Visit: Payer: Self-pay

## 2021-09-25 DIAGNOSIS — G8929 Other chronic pain: Secondary | ICD-10-CM | POA: Diagnosis present

## 2021-09-25 DIAGNOSIS — M545 Low back pain, unspecified: Secondary | ICD-10-CM | POA: Diagnosis not present

## 2021-09-25 DIAGNOSIS — R29898 Other symptoms and signs involving the musculoskeletal system: Secondary | ICD-10-CM | POA: Insufficient documentation

## 2021-09-25 DIAGNOSIS — M6281 Muscle weakness (generalized): Secondary | ICD-10-CM | POA: Diagnosis present

## 2021-09-25 NOTE — Therapy (Signed)
[facility] [address] ?[place], [place], [address] ?Phone: [phone]   Fax:  [phone] ? ?Physical Therapy Treatment ? ?Patient Details  ?Name: Natalie Carroll ?MRN: [id number] ?Date of Birth: May 21, 1946 ?Referring Provider (PT): Jake Bathe ? ? ?Encounter Date: 09/25/2021 ? ? PT End of Session - 09/25/21 1152   ? ? Visit Number 5   ? Number of Visits 13   ? Date for PT Re-Evaluation 10/21/21   ? Authorization Type [employer]   ? Authorization Time Period 09/09/21 to 10/21/21   ? Progress Note Due on Visit 10   ? PT Start Time 1102   ? PT Stop Time 6962   ? PT Time Calculation (min) 41 min   ? Activity Tolerance Patient tolerated treatment well   ? Behavior During Therapy [facility] for tasks assessed/performed   ? ?  ?  ? ?  ? ? ?Past Medical History:  ?Diagnosis Date  ? Allergy   ? Angio-edema   ? Cancer [facility])   ? Skin cancer  ? Colon polyp   ? GERD (gastroesophageal reflux disease)   ? Hyperlipidemia   ? Hypothyroidism   ? Ileitis   ? Osteopenia   ? Sleep apnea   ? Urticaria   ? ? ?Past Surgical History:  ?Procedure Laterality Date  ? BREAST BIOPSY    ? left breast  ? SKIN CANCER EXCISION    ? THYROIDECTOMY, PARTIAL  1991  ? TONSILLECTOMY    ? TUBAL LIGATION    ? ? ?There were no vitals filed for this visit. ? ? Subjective Assessment - 09/25/21 1102   ? ? Subjective I feel good after sessions, but the pain creeps back in when I sit to watch a movie or the news; I had a massage yesterday and she focused on my problem area but the pain just came right back. Pain still creeps back fairly quickly and it hits the same intensity. I have to be sitting at least 30 minutes before I start hurting and need to move.   ? Patient Stated Goals get pain resolved and feel normal again   ? Currently in Pain? Yes   ? Pain Score 6    ? Pain Location Hip   ? Pain Orientation Right   ? Pain Descriptors / Indicators Burning   ? Pain Type Chronic pain   ? ?  ?  ? ?   ? ? ? ? ? ? ? ? ? ? ? ? ? ? ? ? ? ? ? ? Natalie Carroll - 09/25/21 0001   ? ?  ? Lumbar Exercises: Aerobic  ? Nustep BLEs L5x6 minutes   ?  ? Lumbar Exercises: Standing  ? Wall Slides 10 reps   ? Wall Slides Limitations 6# OH hold iwth TA set   ? Other Standing Lumbar Exercises step up on 4 inch step with march and TA set 1x10 B; standing march wit ipsilateral trunk twist and TA set  1x10   ?  ? Lumbar Exercises: Seated  ? Other Seated Lumbar Exercises on blue physiodisc: TA sets 1x10 3 second holds; seated alternating marches with TA set 1x20; trunk rotation with ipsilateral march TA set 1x10 B; lateral trunk crunches 1x10 B   ?  ? Lumbar Exercises: Supine  ? Ab Set 20 reps;5 seconds   ? Other Supine Lumbar Exercises posterior pelvic tilt: double SLR low height 1x10; double bent knee raises with PPT 1x15   ?  Other Supine Lumbar Exercises bridge with clam red TB above knees 1x20   ?  ? Lumbar Exercises: Prone  ? Straight Leg Raise 15 reps   ? ?  ?  ? ?  ? ? ? ? ? ? ? ? ? ? PT Education - 09/25/21 1151   ? ? Education Details exercise form and purpose; less exercises with better from are much better than more exercises with questionable form in terms of pain management and injury prevention   ? Person(s) Educated Patient   ? Methods Explanation   ? Comprehension Verbalized understanding   ? ?  ?  ? ?  ? ? ? PT Short Term Goals - 09/09/21 1037   ? ?  ? PT SHORT TERM GOAL #1  ? Title Will be independent with appropriate progressive HEP   ? Time 3   ? Period Weeks   ? Status New   ? Target Date 09/30/21   ?  ? PT SHORT TERM GOAL #2  ? Title Pain to be no more than 6/10 at worst, and will be no higher than 3/10 at best   ? Time 3   ? Period Weeks   ? Status New   ?  ? PT SHORT TERM GOAL #3  ? Title Will be able to sleep for at least 4 hours without having to wake up to reposition due to pain   ? Time 3   ? Period Weeks   ? Status New   ?  ? PT SHORT TERM GOAL #4  ? Title Will be able to sit for at least  20 continuous minutes without having to reposition with pain no more than 5/10   ? Time 3   ? Period Weeks   ? Status New   ? ?  ?  ? ?  ? ? ? ? PT Long Term Goals - 09/09/21 1038   ? ?  ? PT LONG TERM GOAL #1  ? Title MMT to improve by at least 1 grade in all weak groups to assist in reducing pain/improving function   ? Time 6   ? Period Weeks   ? Status New   ? Target Date 10/21/21   ?  ? PT LONG TERM GOAL #2  ? Title Will be able to sit for at least 2 hours without having to reposition and with pain no more than 3/10 at worst   ? Time 6   ? Period Weeks   ? Status New   ?  ? PT LONG TERM GOAL #3  ? Title Pain will be no more than 3/10 at worst with any functional activity   ? Time 6   ? Period Weeks   ? Status New   ?  ? PT LONG TERM GOAL #4  ? Title Will be compliant with appropriate gym or group based exercise program to maintain functional gains and prevent recurrence of pain   ? Time 6   ? Period Weeks   ? Status New   ? ?  ?  ? ?  ? ? ? ? ? ? ? ? Plan - 09/25/21 1153   ? ? Clinical Impression Statement Natalie Carroll today reporting ongoing pain; sounds like she feels good after activity but it starts to hurt again when she is statically sitting or sleeping. Continued progressing core activities and general BLE strength today. She again asks me for more HEP exercises, I am very hesitant to give these  to her as she continues to require heavy multimodal cuing for quite a few exercises (even familiar ones) still and I do not want her to exacerbate pain by performing an exercise with poor form. Still has a very weak core, I still feel like this is part of her presentation as well. Will continue to progress as able and tolerated.   ? Personal Factors and Comorbidities Comorbidity 2;Age;Past/Current Experience;Time since onset of injury/illness/exacerbation   ? Comorbidities osteopenia, Crohn's disease   ? Examination-Activity Limitations Sit;Sleep   ? Examination-Participation Restrictions Community  Activity;Interpersonal Relationship;Church;Cleaning   ? Stability/Clinical Decision Making Stable/Uncomplicated   ? Clinical Decision Making Low   ? Rehab Potential Good   ? PT Frequency 2x / week   ? PT Duration 6 weeks   ? PT Treatment/Interventions ADLs/Self Care Home Management;Electrical Stimulation;Moist Heat;Iontophoresis 83m/ml Dexamethasone;Gait training;Stair training;Therapeutic activities;Therapeutic exercise;Neuromuscular re-education;Manual techniques;Patient/family education;Functional mobility training;Passive range of motion;Dry needling;Cryotherapy;Ultrasound;Balance training   ? PT Next Visit Plan check leg lengths, SI MET as needed; otherwise, general functional strength and conditioning.   ? Consulted and Agree with Plan of Care Patient   ? ?  ?  ? ?  ? ? ?Patient will benefit from skilled therapeutic intervention in order to improve the following deficits and impairments:  Decreased activity tolerance, Pain, Hypomobility, Decreased strength, Postural dysfunction ? ?Visit Diagnosis: ?Chronic right-sided low back pain without sciatica ? ?Other symptoms and signs involving the musculoskeletal system ? ?Muscle weakness (generalized) ? ? ? ? ?Problem List ?Patient Active Problem List  ? Diagnosis Date Noted  ? Precordial chest pain 06/29/2019  ? Educated about COVID-19 virus infection 06/29/2019  ? GERD (gastroesophageal reflux disease) 07/31/2016  ? Hemorrhoids 07/31/2016  ? ?Natalie Carroll U PT, DPT, PN2  ? ?Supplemental Physical Therapist ?[facility] ? ? ? ? ? ?[address] ?[place] [address]?Phone: [phone]  Fax:  [phone]? ?Name: Natalie Carroll?MRN: [id number]?Date of Birth: 3October 19, 1947? ? ? ?

## 2021-09-30 ENCOUNTER — Ambulatory Visit: Payer: Medicare HMO | Admitting: Physical Therapy

## 2021-10-02 ENCOUNTER — Ambulatory Visit: Payer: Medicare HMO | Admitting: Physical Therapy

## 2022-02-06 ENCOUNTER — Encounter: Payer: Self-pay | Admitting: Psychology

## 2022-03-12 ENCOUNTER — Encounter: Payer: Self-pay | Admitting: Gastroenterology

## 2022-03-12 ENCOUNTER — Ambulatory Visit: Payer: Medicare HMO | Admitting: Gastroenterology

## 2022-03-12 VITALS — BP 100/60 | HR 76 | Ht 68.0 in | Wt 129.0 lb

## 2022-03-12 DIAGNOSIS — K649 Unspecified hemorrhoids: Secondary | ICD-10-CM | POA: Diagnosis not present

## 2022-03-12 DIAGNOSIS — K219 Gastro-esophageal reflux disease without esophagitis: Secondary | ICD-10-CM

## 2022-03-12 DIAGNOSIS — Z79899 Other long term (current) drug therapy: Secondary | ICD-10-CM | POA: Diagnosis not present

## 2022-03-12 DIAGNOSIS — K5000 Crohn's disease of small intestine without complications: Secondary | ICD-10-CM | POA: Diagnosis not present

## 2022-03-12 MED ORDER — FAMOTIDINE 20 MG PO TABS: 20.0000 mg | ORAL_TABLET | Freq: Every day | ORAL | 6 refills | Status: DC

## 2022-03-12 NOTE — Progress Notes (Signed)
HPI :  76 year old female here for a follow-up visit for suspected Crohn's disease and GERD. I have not seen her since 08/2019.    She has a suspected Crohn's disease based off work-up as below, listed in chronological order.   Initial workup done in [place] - Dr. Vella Redhead CT enterography in 2011 showed focal ileitis IBD serology panel NEGATIVE   01/07/2012 - colonoscopy showed nonerosive ileitis to the distal ileum, normal colon - path not in records 01/07/2012 - EGD showed mild gastritis, normal duodenum   02/24/2012 she had a capsule endoscopy showing focal areas of active enteritis in the distal jejunum / proximal ileum. Treated with Pentasa at the time. Also received a course of Enterocort   Colonoscopy in February 2018 with me, 2 small AVMs, the terminal ileum was noted to be mildly erythematous but without ulcers and colon was normal.  Biopsies were done to rule out microscopic colitis and these were negative.  There was some focal active inflammation on ileal biopsies, nonspecific.  Question of Crohn's was again raised.  Capsule endoscopy was suggested but she decided not to follow through with that at that time.     Labs obtained in 2020 during symptoms of diarrhea, as below:   ESR 34 Fecal calprotectin 413   She was given an empiric course of budesonide previously which provided some benefit for her.  She eventually tapered off this.  We performed a follow-up MR enterography to further assess for active inflammation of her bowel and restage things   MRE 07/26/19 -  IMPRESSION: 1. Loops of distal ileum demonstrate abnormal wall thickening and mucosal enhancement along with some small areas of skip lesions, favoring active terminal ileitis/Crohn's disease. No abscess is identified. No definite involvement of the stomach, jejunum, or colon. 2. Prominence of stool in the colon as can be encountered in the setting of constipation; no current colonic air-fluid levels  are identified to favor active diarrheal process. 3. Scattered small nonenhancing fluid signal intensity lesions in the liver favoring cysts. 4. 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 5.  Aortic Atherosclerosis (ICD10-I70.0). 6. Lumbar spondylosis and degenerative disc disease.    At her last visit we had discussed if she wanted treatment for her Crohn's disease.  We had done lab work to prepare for that and looked into Entyvio versus Humira regarding cost.  Humira was much more affordable for her.  After discussing her options she opted to hold off on therapy and monitor.  She was a bit hesitant to start biologic therapy.  She states she has not had any significant flares of her symptoms since have seen her.  Her typical index symptoms are abdominal pain and loose stools.  She has not required any steroids or ED visits or issues related to this since have seen her.  She has roughly 1 bowel movement per day without blood.  She has some mild cramping with a bowel movement but this has been stable and otherwise feeling pretty well.  Her weight is stable.  She did have an issue with some irritated hemorrhoids in recent months, used some topical Preparation H which resolved her symptoms.  Her hemorrhoids do not bother her routinely.  She is not using any NSAIDs routinely, using Tylenol as needed for her sciatic pain.  She takes a turmeric supplement daily.  She also takes B12 supplementation and vitamin D.  She has had had labs with her primary care Dr. Doreene Nest in recent months showing no  anemia.  Otherwise she has a longstanding history of GERD.  We have discussed her options in the past in regards to chronic PPI versus TIF.  She has had an EGD in the past showing small hiatal hernia but no Barrett's esophagus.  Barium study was done as she was considering a TIF at 1 point time, the exam looked okay.  She has continued omeprazole 20 mg daily.  She inquires about  long-term risks.  She does have osteopenia but no significant fracture history.  She has been on Pepcid several years ago but not recently.  We discussed her options again for reflux therapy, she is not interested in pursuing TIF right now.   EGD 07/12/19 -  - The exam of the esophagus was normal, no hiatal hernia. - A few 3 mm sessile polyps were found in the gastric body. A few were removed with cold forceps for histology for sampling, suspect benign fundic gland polyps in the setting of PPI use. - The exam of the stomach was otherwise normal. Retroflexed views of the cardia show Hill grade I. - The duodenal bulb and second portion of the duodenum were normal.   Barium swallow 07/27/19 - normal study - no reflux seen    Past Medical History:  Diagnosis Date   Allergy    Angio-edema    Cancer ([place])    Skin cancer   Colon polyp    GERD (gastroesophageal reflux disease)    Hyperlipidemia    Hypothyroidism    Ileitis    Osteopenia    Sleep apnea    Urticaria      Past Surgical History:  Procedure Laterality Date   BREAST BIOPSY     left breast   SKIN CANCER EXCISION     THYROIDECTOMY, PARTIAL  1991   TONSILLECTOMY     TUBAL LIGATION     Family History  Problem Relation Age of Onset   Diabetes Mother    Heart disease Mother 67       Stents   Ovarian cancer Sister    Kidney disease Sister    Bladder Cancer Sister    CVA Sister    Diabetes Brother    Heart disease Brother        Valve surgery   Colon cancer Neg Hx    Esophageal cancer Neg Hx    Rectal cancer Neg Hx    Stomach cancer Neg Hx    Allergic rhinitis Neg Hx    Asthma Neg Hx    Eczema Neg Hx    Urticaria Neg Hx    Social History   Tobacco Use   Smoking status: Former    Types: Cigarettes    Quit date: 07/28/1989    Years since quitting: 32.6   Smokeless tobacco: Never  Vaping Use   Vaping Use: Never used  Substance Use Topics   Alcohol use: No   Drug use: No   Current Outpatient Medications   Medication Sig Dispense Refill   acetaminophen (TYLENOL) 325 MG tablet Take 650 mg by mouth every 6 (six) hours as needed.     cholecalciferol (VITAMIN D3) 25 MCG (1000 UT) tablet Take 1,000 Units by mouth daily.     clobetasol ointment (TEMOVATE) 0.05 %      escitalopram (LEXAPRO) 10 MG tablet Take 1 tablet by mouth daily.     estradiol (ESTRACE) 0.1 MG/GM vaginal cream estradiol 0.01% (0.1 mg/gram) vaginal cream  INSERT 0.5 GRAM VAGINALLY 2 TIMES EVERY WEEK  famotidine (PEPCID) 20 MG tablet Take 1 tablet (20 mg total) by mouth daily. 30 tablet 6   levothyroxine (SYNTHROID, LEVOTHROID) 112 MCG tablet Take 1 tablet by mouth daily.     Magnesium Oxide 250 MG TABS Take by mouth daily.     Multiple Vitamin (MULTIVITAMIN) tablet Take 1 tablet by mouth daily.     omeprazole (PRILOSEC) 20 MG capsule Take one tablet (38m) by mouth once or twice daily 180 capsule 1   OVER THE COUNTER MEDICATION Vitamin E, one capsule daily.     pravastatin (PRAVACHOL) 20 MG tablet Take 20 mg by mouth daily.     triamcinolone ointment (KENALOG) 0.1 % Apply 1 application topically 2 (two) times daily. 454 g 3   vitamin B-12 (CYANOCOBALAMIN) 1000 MCG tablet Take 1,000 mcg by mouth daily.     No current facility-administered medications for this visit.   No Known Allergies   Review of Systems: All systems reviewed and negative except where noted in HPI.   Labs reviewed in care everywhere  Physical Exam: BP 100/60   Pulse 76   Ht 5' 8"  (1.727 m)   Wt 129 lb (58.5 kg)   BMI 19.61 kg/m  Constitutional: Pleasant,well-developed, female in no acute distress. Neurological: Alert and oriented to person place and time. Psychiatric: Normal mood and affect. Behavior is normal.   ASSESSMENT: 76y.o. female here for assessment of the following  1. Cro[place]'s disease of [place] intestine [place]   2. Gastroesophageal reflux disease, unspecified whether esophagitis present   3. Long-term current  use of proton pump inhibitor therapy   4. Hemorrhoids, unspecified hemorrhoid type    Patient with a likely diagnosis of ileal Crohn's disease based on work-up to date.  We had discussed treatment options at her last visit, which are namely biologic therapies.  We inquired about Entyvio and Humira at her last visit, Humira was better covered by insurance but after discussion of her options she declined therapy.  Essentially she has been doing pretty well since have last seen her, without any significant symptoms.  Her labs look good, no anemia.  We discussed that she likely has mild Crohn's disease, again discussed if she wanted therapy for this.  Risks of not treating her are progressive disease, risk for bowel obstruction and symptoms etc.  She understands risks of potential therapy.  After discussing her options, given she is done so well in recent years she wants to observe for now which I think is reasonable.  She has had this diagnosis now for several years and has not had any complications from it.  She understands she is at risk for complications of Crohn's at some point, she will contact me if her symptoms recur and would have low threshold to start budesonide in the setting which she has responded well to in the past.  I will see her at least once yearly for this issue.  We discussed role of doing stool testing or follow-up imaging for her Crohn's, however if there are no plans to treat and we will monitor regardless, holding off on surveillance for now.  We discussed for her hemorrhoids she can use some Calmol 4 suppositories over-the-counter as needed or Preparation H if that works.  We discussed hemorrhoid banding briefly, I do not think she warrants that right now for mild intermittent symptoms.  Otherwise we discussed her reflux history at length.  Low-dose omeprazole generally controls her symptoms pretty well for her.  I do not  feel that she warrants a TIF at this time and less she really  wanted to come off all medication.  We discussed long-term use of PPI risks related to that.  Her renal function is normal, she does have osteopenia, she is at high risk for bone fracture.  She is interested in tapering to Pepcid to see if that may control her symptoms.  She will try omeprazole 20 mg 1 day, and Pepcid 20 mg the next, try to slowly wean off omeprazole.  If she finds her symptoms bother her more than she may after resume omeprazole every day.  She can see me once yearly for this.  Holding off on TIF at this time.   PLAN: - monitoring Crohn's disease for now, she declines biologic therapy. If flares of symptoms in the interim she will contact me, low threshold to give budesonide PRN - continue vitamin supplementation - labs yearly - discussed long term PPI use, She will pepcid 54m / day PRN - take omeprazole one day and pepcid the next, try to wean off pepcid  - declines TIF for now - Calmol 4 suppositories PRN - follow up in the office once yearly for CD and GERD, or sooner with issues.  SJolly Mango MD [facility]

## 2022-03-12 NOTE — Patient Instructions (Signed)
_______________________________________________________  If you are age 75 or older, your body mass index should be between 23-30. Your Body mass index is 19.61 kg/m. If this is out of the aforementioned range listed, please consider follow up with your Primary Care Provider.  If you are age 82 or younger, your body mass index should be between 19-25. Your Body mass index is 19.61 kg/m. If this is out of the aformentioned range listed, please consider follow up with your Primary Care Provider.   ________________________________________________________  The  GI providers would like to encourage you to use [facility] to communicate with providers for non-urgent requests or questions.  Due to long hold times on the telephone, sending your provider a message by [facility] may be a faster and more efficient way to get a response.  Please allow 48 business hours for a response.  Please remember that this is for non-urgent requests.  _______________________________________________________  We have sent the following medications to your pharmacy for you to pick up at your convenience:  Pepcid - take Omeprazole one day, Pepcid the next.  You may take Calmol 4 suppositories over the counter as needed.  Please follow up in one year

## 2022-08-27 ENCOUNTER — Ambulatory Visit: Payer: Medicare HMO | Admitting: Psychology

## 2022-09-25 ENCOUNTER — Ambulatory Visit: Payer: HMO | Attending: Pain Medicine

## 2022-09-25 DIAGNOSIS — M545 Low back pain, unspecified: Secondary | ICD-10-CM | POA: Diagnosis present

## 2022-09-25 DIAGNOSIS — M5459 Other low back pain: Secondary | ICD-10-CM | POA: Insufficient documentation

## 2022-09-25 DIAGNOSIS — M25551 Pain in right hip: Secondary | ICD-10-CM | POA: Diagnosis present

## 2022-09-25 DIAGNOSIS — G8929 Other chronic pain: Secondary | ICD-10-CM | POA: Diagnosis present

## 2022-09-25 DIAGNOSIS — M6281 Muscle weakness (generalized): Secondary | ICD-10-CM | POA: Diagnosis present

## 2022-09-25 NOTE — Therapy (Signed)
OUTPATIENT PHYSICAL THERAPY THORACOLUMBAR EVALUATION   Patient Name: Natalie Carroll MRN: [id number] DOB:09-06-1945, 77 y.o., female Today's Date: 09/25/2022  END OF SESSION:  PT End of Session - 09/25/22 1227     Visit Number 1    Date for PT Re-Evaluation 12/04/22    Authorization Type [employer]    PT Start Time 1228    PT Stop Time 1315    PT Time Calculation (min) 47 min    Activity Tolerance Patient tolerated treatment well;Patient limited by pain    Behavior During Therapy [facility] for tasks assessed/performed             Past Medical History:  Diagnosis Date   Allergy    Angio-edema    Cancer ([place])    Skin cancer   Colon polyp    GERD (gastroesophageal reflux disease)    Hyperlipidemia    Hypothyroidism    Ileitis    Osteopenia    Sleep apnea    Urticaria    Past Surgical History:  Procedure Laterality Date   BREAST BIOPSY     left breast   SKIN CANCER EXCISION     THYROIDECTOMY, PARTIAL  1991   TONSILLECTOMY     TUBAL LIGATION     Patient Active Problem List   Diagnosis Date Noted   Precordial chest pain 06/29/2019   Educated about COVID-19 virus infection 06/29/2019   GERD (gastroesophageal reflux disease) 07/31/2016   Hemorrhoids 07/31/2016    PCP: Suzanna Obey  REFERRING PROVIDER: Earlyne Iba  REFERRING DIAG:   M79.18 (ICD-10-CM) - Myalgia, other site  M70.71 (ICD-10-CM) - Other bursitis of hip, right hip    Rationale for Evaluation and Treatment: Rehabilitation  THERAPY DIAG:  Chronic right-sided low back pain without sciatica  Muscle weakness (generalized)  Other low back pain  Pain in right hip  ONSET DATE: 09/10/22  SUBJECTIVE:                                                                                                                                                                                           SUBJECTIVE STATEMENT: Since I been to the doctor and got this order, my back gave out on me. I am not in  severe pain right now but if I make a wrong move it will hurt. I have been icing and heating and taking Ibuprofen. This has been going on for 3 days.   PERTINENT HISTORY:  No remarkable history  PAIN:  Are you having pain? Yes: NPRS scale: 6/10 Pain location: R side low back and hip Pain description: sharp, shooting, annoying, nagging Aggravating factors: sitting and  standing too long Relieving factors: ice and heat  PRECAUTIONS: None  WEIGHT BEARING RESTRICTIONS: No  FALLS:  Has patient fallen in last 6 months? No  LIVING ENVIRONMENT: Lives with: lives with their family Lives in: House/apartment Stairs: No  OCCUPATION: Retired  PLOF: Independent  PATIENT GOALS: to get rid of pain   OBJECTIVE:   DIAGNOSTIC FINDINGS:  None for hip and back   SCREENING FOR RED FLAGS: Bowel or bladder incontinence: No Spinal tumors: No Cauda equina syndrome: No Compression fracture: No Abdominal aneurysm: No  COGNITION: Overall cognitive status: Within functional limits for tasks assessed     SENSATION: WFL  MUSCLE LENGTH: Hamstrings: mild tightness in BLE  POSTURE: rounded shoulders and flexed trunk   PALPATION: TTP SIJ   LUMBAR ROM:   AROM eval  Flexion WFL  Extension WFL  Right lateral flexion 50% with pain in back and hip  Left lateral flexion 75% no pain just tight  Right rotation WFL  Left rotation WFL   (Blank rows = not tested)  LOWER EXTREMITY ROM:  grossly WFL  LOWER EXTREMITY MMT:    MMT Right eval Left eval  Hip flexion 3+ 4+  Hip extension 4- 4-  Hip abduction 4 4+  Hip adduction    Hip internal rotation    Hip external rotation    Knee flexion 5 5  Knee extension 5 5  Ankle dorsiflexion    Ankle plantarflexion    Ankle inversion    Ankle eversion     (Blank rows = not tested)  LUMBAR SPECIAL TESTS:  Straight leg raise test: Negative  FUNCTIONAL TESTS:  5 times sit to stand: 16.11s  GAIT: Distance walked: in clinic  distances Assistive device utilized: None Level of assistance: Modified independence Comments: antalgic gait, circumducts RLE a little bit, flexed at trunk, decreased step length with RLE  TODAY'S TREATMENT:                                                                                                                              DATE: EVAL- 09/25/22    PATIENT EDUCATION:  Education details: HEP and POC Person educated: Patient Education method: Explanation Education comprehension: verbalized understanding  HOME EXERCISE PROGRAM: Access Code: [email or web address] URL: [email or web address] Date: 09/25/2022 Prepared by: Andris Baumann  Exercises - Supine Bridge  - 1 x daily - 7 x weekly - 2 sets - 10 reps - Supine Lower Trunk Rotation  - 1 x daily - 7 x weekly - 2 sets - 10 reps - Clamshell with Resistance  - 1 x daily - 7 x weekly - 2 sets - 10 reps - Supine Figure 4 Piriformis Stretch  - 2 x daily - 7 x weekly - 30 hold  ASSESSMENT:  CLINICAL IMPRESSION: Patient is a 77 y.o. female who was seen today for physical therapy evaluation and treatment for low back and R hip pain. She is walking with antalgic gait pattern and slow  as she is afraid her back will give out. After evaluation and going through HEP patient has increased pain and states movements are hard. We ended with some heat for her back as she stated she has to go grocery shopping afterwards. Patient presents with SIJ pain and weakness in core, R hip, and low back. She will benefit from skilled PT to address her impairments to be able to complete her ADLs without having pain.   OBJECTIVE IMPAIRMENTS: Abnormal gait, difficulty walking, decreased strength, and pain.   ACTIVITY LIMITATIONS: sitting, standing, and locomotion level  PARTICIPATION LIMITATIONS: cleaning, shopping, and community activity  REHAB POTENTIAL: Good  CLINICAL DECISION MAKING: Stable/uncomplicated  EVALUATION COMPLEXITY: Low   GOALS: Goals  reviewed with patient? Yes  SHORT TERM GOALS: Target date: 10/30/22  Patient will be independent with initial HEP.  Goal status: INITIAL  4.  Patient will demonstrate improved functional strength as demonstrated by 5xSTS <12. Baseline: 16.11s Goal status: INITIAL   LONG TERM GOALS: Target date: 12/04/22  Patient will be independent with advanced/ongoing HEP to improve outcomes and carryover.  Goal status: INITIAL  2.  Patient will report 75% improvement in low back and R hip pain to improve QOL.  Baseline: 6/10 constant, 10/10 when it hurts Goal status: INITIAL  3.  Patient will demonstrate full pain free lumbar ROM to perform ADLs.   Goal status: INITIAL  6.  Patient will tolerate 30 min of (standing/sitting/walking) to perform household chores. Baseline: pain after sitting or standing 5-38mns Goal status: INITIAL   PLAN:  PT FREQUENCY: 1-2x/week  PT DURATION: 10 weeks  PLANNED INTERVENTIONS: Therapeutic exercises, Therapeutic activity, Neuromuscular re-education, Balance training, Gait training, Patient/Family education, Self Care, Joint mobilization, Stair training, Dry Needling, Electrical stimulation, Spinal manipulation, Spinal mobilization, Cryotherapy, Moist heat, Traction, Ionotophoresis '4mg'$ /ml Dexamethasone, and Manual therapy.  PLAN FOR NEXT SESSION: light gym activities for low back and hip as tolerated, pain management (e-stim and heat, maybe some STM)   MAndris Baumann PT 09/25/2022, 1:30 PM

## 2022-09-30 ENCOUNTER — Telehealth: Payer: Self-pay | Admitting: Gastroenterology

## 2022-09-30 NOTE — Telephone Encounter (Signed)
Patient called requesting an appt for diarrhea and lower back pain she was given the next available with an APP. The patient would like to discuss her symptoms with a nurse to get advise in the meantime.

## 2022-09-30 NOTE — Telephone Encounter (Signed)
Returned call to patient. Pt states that she noticed the diarrhea about a week ago after she "threw her back out". Pt states that she thinks her back injury caused the diarrhea, but her spine doctor told her that was less likely. Pt has been taking Imodium PRN and that seems to control her diarrhea. Pt does not feel like she is having a Crohn's flare up at this time, she knows to contact us if symptoms worsen in the interim. Pt will continue Imodium PRN and her appt has been moved to Tuesday, 10/14/22 at 1:30 with Tye Savoy, NP. Pt verbalized understanding and had no concerns at the end of the call.

## 2022-10-01 ENCOUNTER — Other Ambulatory Visit: Payer: Self-pay

## 2022-10-01 ENCOUNTER — Ambulatory Visit: Payer: HMO | Attending: Pain Medicine

## 2022-10-01 DIAGNOSIS — R29898 Other symptoms and signs involving the musculoskeletal system: Secondary | ICD-10-CM | POA: Insufficient documentation

## 2022-10-01 DIAGNOSIS — M6281 Muscle weakness (generalized): Secondary | ICD-10-CM | POA: Diagnosis present

## 2022-10-01 DIAGNOSIS — M545 Low back pain, unspecified: Secondary | ICD-10-CM | POA: Diagnosis not present

## 2022-10-01 DIAGNOSIS — G8929 Other chronic pain: Secondary | ICD-10-CM | POA: Diagnosis present

## 2022-10-01 DIAGNOSIS — M25551 Pain in right hip: Secondary | ICD-10-CM | POA: Insufficient documentation

## 2022-10-01 DIAGNOSIS — M5459 Other low back pain: Secondary | ICD-10-CM | POA: Diagnosis present

## 2022-10-01 NOTE — Therapy (Signed)
OUTPATIENT PHYSICAL THERAPY THORACOLUMBAR Treatment   Patient Name: Natalie Carroll MRN: [id number] DOB:04-14-1946, 77 y.o., female Today's Date: 10/01/2022  END OF SESSION:  PT End of Session - 10/01/22 1156     Visit Number 2    Date for PT Re-Evaluation 12/04/22    Authorization Type [employer]    Authorization Time Period 09/09/21 to 10/21/21    Progress Note Due on Visit 10    PT Start Time 1100    PT Stop Time 1145    PT Time Calculation (min) 45 min    Activity Tolerance Patient tolerated treatment well    Behavior During Therapy [facility] for tasks assessed/performed              Past Medical History:  Diagnosis Date   Allergy    Angio-edema    Cancer ([place])    Skin cancer   Colon polyp    GERD (gastroesophageal reflux disease)    Hyperlipidemia    Hypothyroidism    Ileitis    Osteopenia    Sleep apnea    Urticaria    Past Surgical History:  Procedure Laterality Date   BREAST BIOPSY     left breast   SKIN CANCER EXCISION     THYROIDECTOMY, PARTIAL  1991   TONSILLECTOMY     TUBAL LIGATION     Patient Active Problem List   Diagnosis Date Noted   Precordial chest pain 06/29/2019   Educated about COVID-19 virus infection 06/29/2019   GERD (gastroesophageal reflux disease) 07/31/2016   Hemorrhoids 07/31/2016    PCP: Suzanna Obey  REFERRING PROVIDER: Earlyne Iba  REFERRING DIAG:   M79.18 (ICD-10-CM) - Myalgia, other site  M70.71 (ICD-10-CM) - Other bursitis of hip, right hip    Rationale for Evaluation and Treatment: Rehabilitation  THERAPY DIAG:  Chronic right-sided low back pain without sciatica  Muscle weakness (generalized)  Other low back pain  Other symptoms and signs involving the musculoskeletal system  Pain in right hip  ONSET DATE: 09/10/22  SUBJECTIVE:                                                                                                                                                                                            SUBJECTIVE STATEMENT: I have had some diarrhea and abdominal pain since I was here last time, it was intense for a few days and also seemed to hurt in my groin and radiate to the R hip where I have been experiencing my pain.  I returned to the spine doctor but he didn't change anything or order additional x rays.  I'm trying to  get into my GI doctor, waiting for an appt   PERTINENT HISTORY:  No remarkable history  PAIN:  Are you having pain? Yes: NPRS scale: 0 to 4/10 Pain location: R side low back and hip Pain description: sharp, shooting, annoying, nagging Aggravating factors: with prolonged sitting,  Relieving factors: ice and heat  PRECAUTIONS: None  WEIGHT BEARING RESTRICTIONS: No  FALLS:  Has patient fallen in last 6 months? No  LIVING ENVIRONMENT: Lives with: lives with their family Lives in: House/apartment Stairs: No  OCCUPATION: Retired  PLOF: Independent  PATIENT GOALS: to get rid of pain   OBJECTIVE:   DIAGNOSTIC FINDINGS:  None for hip and back   SCREENING FOR RED FLAGS: Bowel or bladder incontinence: No Spinal tumors: No Cauda equina syndrome: No Compression fracture: No Abdominal aneurysm: No  COGNITION: Overall cognitive status: Within functional limits for tasks assessed     SENSATION: WFL  MUSCLE LENGTH: Hamstrings: mild tightness in BLE  POSTURE: rounded shoulders and flexed trunk   PALPATION: TTP SIJ   LUMBAR ROM:   AROM eval  Flexion WFL  Extension WFL  Right lateral flexion 50% with pain in back and hip  Left lateral flexion 75% no pain just tight  Right rotation WFL  Left rotation WFL   (Blank rows = not tested)  LOWER EXTREMITY ROM:  grossly WFL  LOWER EXTREMITY MMT:    MMT Right eval Left eval  Hip flexion 3+ 4+  Hip extension 4- 4-  Hip abduction 4 4+  Hip adduction    Hip internal rotation    Hip external rotation    Knee flexion 5 5  Knee extension 5 5  Ankle dorsiflexion    Ankle  plantarflexion    Ankle inversion    Ankle eversion     (Blank rows = not tested)  LUMBAR SPECIAL TESTS:  Straight leg raise test: Negative  FUNCTIONAL TESTS:  5 times sit to stand: 16.11s  GAIT: Distance walked: in clinic distances Assistive device utilized: None Level of assistance: Modified independence Comments: antalgic gait, circumducts RLE a little bit, flexed at trunk, decreased step length with RLE  TODAY'S TREATMENT:      10/01/22: Manual:  L side lying for cross friction massage R gluteus medius and minimus and post Si ligaments on R Brief SI assessment cluster, + R thigh thrust, + distraction and compression on R.  Therefore manually assisted with supine muscle energy correction for SI  alignment, with 5 sec holds, 3 reps each leg with resisted hip and knee extension.  Therex:  Patient instructed in SI jt self correction with resistance provided from hands , same technique as above in supine,alternating with 5 sec holds, 3 reps each side Instructed in additional supine stabilization ex for lumbosacral region: -hooklying hip add, and abd with ball between knees and strap around knees, 10 sec holds, 10 reps -seated alternating isometric hip and knee ext to align pelvis 3 x each, 5 sec -seated hip isometric abd with strap 10 sec holds, 5 reps each -standing penguins, with green t band around thighs, for dynamic lateral hip strengthening, performed until fatigue, for 30 sec.  Noted increased B genu valgum trunk sway  with this activity.  DATE: EVAL- 09/25/22    PATIENT EDUCATION:  Education details: HEP and POC Person educated: Patient Education method: Explanation Education comprehension: verbalized understanding  HOME EXERCISE PROGRAM: Access Code: [email or web address] URL: [email or web address] Date: 10/01/2022 Prepared by: Warren Lacy  Awa Bachicha  Exercises - Hooklying Isometric Hip Abduction Adduction with Belt and Ball  - 1 x daily - 7 x weekly - 1 sets - 10 reps - 90/90 SI Joint Self-Correction with Dowel  - 1 x daily - 7 x weekly - 3 sets - 10 reps - Side Stepping with Resistance at Ankles  - 1 x daily - 7 x weekly - 3 sets - 10 reps - Seated Isometric Hip Abduction with Belt  - 1 x daily - 7 x weekly - 3 sets - 10 reps  Access Code: [email or web address] URL: [email or web address] Date: 09/25/2022 Prepared by: Andris Baumann  Exercises - Supine Bridge  - 1 x daily - 7 x weekly - 2 sets - 10 reps - Supine Lower Trunk Rotation  - 1 x daily - 7 x weekly - 2 sets - 10 reps - Clamshell with Resistance  - 1 x daily - 7 x weekly - 2 sets - 10 reps - Supine Figure 4 Piriformis Stretch  - 2 x daily - 7 x weekly - 30 hold  ASSESSMENT:  CLINICAL IMPRESSION: Patient is a 77 y.o. female who was seen today for physical therapy treatment for low back and R lat hip pain. She is still experiencing some intermittent R lateral hip and lower back pain.  Reports worse with sitting. Since she reported some increased Sx with her onset of diarrhea a couple of days ago screened again for red flags, and no fever, significant weight loss, new rash or nigh sweats.  Did reinforce that she needs to see her GI doc as she is experiencing recurrent diarrhea.  Found some positive Sx with SI testing for R, therefore added more stabilization ex for Si , lumbar region today.  She should continue to benefit from skilled PT to address her recovery of function. Increased sway, decreased motor control noted today with standing exercise so need to incorporate more standing static and dynamic stability ex. OBJECTIVE IMPAIRMENTS: Abnormal gait, difficulty walking, decreased strength, and pain.   ACTIVITY LIMITATIONS: sitting, standing, and locomotion level  PARTICIPATION LIMITATIONS: cleaning, shopping, and community activity  REHAB POTENTIAL: Good  CLINICAL  DECISION MAKING: Stable/uncomplicated  EVALUATION COMPLEXITY: Low   GOALS: Goals reviewed with patient? Yes  SHORT TERM GOALS: Target date: 10/30/22  Patient will be independent with initial HEP.  Goal status: IN PROGRESS  4.  Patient will demonstrate improved functional strength as demonstrated by 5xSTS <12. Baseline: 16.11s Goal status: INITIAL   LONG TERM GOALS: Target date: 12/04/22  Patient will be independent with advanced/ongoing HEP to improve outcomes and carryover.  Goal status: IN PROGRESS  2.  Patient will report 75% improvement in low back and R hip pain to improve QOL.  Baseline: 6/10 constant, 10/10 when it hurts Goal status: IN PROGRESS  3.  Patient will demonstrate full pain free lumbar ROM to perform ADLs.   Goal status: IN PROGRESS  6.  Patient will tolerate 30 min of (standing/sitting/walking) to perform household chores. Baseline: pain after sitting or standing 5-37mns Goal status: IN PROGRESS   PLAN:  PT FREQUENCY: 1-2x/week  PT DURATION: 10 weeks  PLANNED INTERVENTIONS: Therapeutic exercises, Therapeutic activity, Neuromuscular re-education, Balance training, Gait training, Patient/Family education, Self Care, Joint mobilization, Stair training, Dry Needling, Electrical stimulation, Spinal  manipulation, Spinal mobilization, Cryotherapy, Moist heat, Traction, Ionotophoresis '4mg'$ /ml Dexamethasone, and Manual therapy.  PLAN FOR NEXT SESSION: light gym activities for low back and hip as tolerated, pain management (e-stim and heat, maybe some STM)   Aras Albarran L Danelia Snodgrass, PT 10/01/2022, 12:18 PM

## 2022-10-03 ENCOUNTER — Other Ambulatory Visit: Payer: Self-pay

## 2022-10-03 ENCOUNTER — Encounter (HOSPITAL_BASED_OUTPATIENT_CLINIC_OR_DEPARTMENT_OTHER): Payer: Self-pay

## 2022-10-03 ENCOUNTER — Ambulatory Visit: Payer: HMO | Admitting: Physical Therapy

## 2022-10-03 ENCOUNTER — Emergency Department (HOSPITAL_BASED_OUTPATIENT_CLINIC_OR_DEPARTMENT_OTHER)
Admission: EM | Admit: 2022-10-03 | Discharge: 2022-10-03 | Disposition: A | Discharge status: Home / Self Care | Payer: HMO | Attending: Emergency Medicine | Admitting: Emergency Medicine

## 2022-10-03 DIAGNOSIS — I4510 Unspecified right bundle-branch block: Secondary | ICD-10-CM | POA: Insufficient documentation

## 2022-10-03 DIAGNOSIS — R197 Diarrhea, unspecified: Secondary | ICD-10-CM | POA: Insufficient documentation

## 2022-10-03 DIAGNOSIS — K5090 Crohn's disease, unspecified, without complications: Secondary | ICD-10-CM | POA: Diagnosis not present

## 2022-10-03 LAB — CBC WITH DIFFERENTIAL/PLATELET
Abs Immature Granulocytes: 0 10*3/uL (ref 0.00–0.07)
Basophils Absolute: 0 10*3/uL (ref 0.0–0.1)
Basophils Relative: 1 %
Eosinophils Absolute: 0.1 10*3/uL (ref 0.0–0.5)
Eosinophils Relative: 3 %
HCT: 36 % (ref 36.0–46.0)
Hemoglobin: 11.7 g/dL — ABNORMAL LOW (ref 12.0–15.0)
Immature Granulocytes: 0 %
Lymphocytes Relative: 22 %
Lymphs Abs: 0.8 10*3/uL (ref 0.7–4.0)
MCH: 29.6 pg (ref 26.0–34.0)
MCHC: 32.5 g/dL (ref 30.0–36.0)
MCV: 91.1 fL (ref 80.0–100.0)
Monocytes Absolute: 0.4 10*3/uL (ref 0.1–1.0)
Monocytes Relative: 12 %
Neutro Abs: 2.1 10*3/uL (ref 1.7–7.7)
Neutrophils Relative %: 62 %
Platelets: 190 10*3/uL (ref 150–400)
RBC: 3.95 MIL/uL (ref 3.87–5.11)
RDW: 13.3 % (ref 11.5–15.5)
WBC: 3.4 10*3/uL — ABNORMAL LOW (ref 4.0–10.5)
nRBC: 0 % (ref 0.0–0.2)

## 2022-10-03 LAB — URINALYSIS, ROUTINE W REFLEX MICROSCOPIC
Bilirubin Urine: NEGATIVE
Glucose, UA: NEGATIVE mg/dL
Ketones, ur: NEGATIVE mg/dL
Nitrite: NEGATIVE
Protein, ur: NEGATIVE mg/dL
Specific Gravity, Urine: 1.015 (ref 1.005–1.030)
pH: 7 (ref 5.0–8.0)

## 2022-10-03 LAB — COMPREHENSIVE METABOLIC PANEL
ALT: 15 U/L (ref 0–44)
AST: 24 U/L (ref 15–41)
Albumin: 4 g/dL (ref 3.5–5.0)
Alkaline Phosphatase: 89 U/L (ref 38–126)
Anion gap: 5 (ref 5–15)
BUN: 17 mg/dL (ref 8–23)
CO2: 29 mmol/L (ref 22–32)
Calcium: 10 mg/dL (ref 8.9–10.3)
Chloride: 101 mmol/L (ref 98–111)
Creatinine, Ser: 0.82 mg/dL (ref 0.44–1.00)
GFR, Estimated: >60 mL/min (ref >60)
Glucose, Bld: 86 mg/dL (ref 70–99)
Potassium: 4.4 mmol/L (ref 3.5–5.1)
Sodium: 135 mmol/L (ref 135–145)
Total Bilirubin: 0.6 mg/dL (ref 0.3–1.2)
Total Protein: 7.2 g/dL (ref 6.5–8.1)

## 2022-10-03 LAB — LIPASE, BLOOD: Lipase: 154 U/L — ABNORMAL HIGH (ref 11–51)

## 2022-10-03 LAB — URINALYSIS, MICROSCOPIC (REFLEX)

## 2022-10-03 MED ORDER — FLUORESCEIN SODIUM 1 MG OP STRP: 1.0000 | ORAL_STRIP | Freq: Once | OPHTHALMIC | Status: DC

## 2022-10-03 MED ORDER — LACTATED RINGERS IV BOLUS
1000.0000 mL | Freq: Once | INTRAVENOUS | Status: AC
  Administered 2022-10-03: 1000 mL via INTRAVENOUS

## 2022-10-03 MED ORDER — BUDESONIDE ER 9 MG PO TB24: 9.0000 mg | ORAL_TABLET | Freq: Every day | ORAL | 0 refills | Status: DC

## 2022-10-03 MED ORDER — BUDESONIDE 3 MG PO CPEP: 9.0000 mg | ORAL_CAPSULE | Freq: Every day | ORAL | 0 refills | Status: DC

## 2022-10-03 NOTE — ED Provider Notes (Signed)
[place] [facility] [facility] Provider Note   CSN: [id number] Arrival date & time: 10/03/22  1103     History Chief Complaint  Patient presents with   Diarrhea    Natalie Carroll is a 77 y.o. female.  Patient with past medical history significant for Crohn's disease presents emergency department complaining of diarrhea.  She reports that she has had diarrhea for the last 2 weeks off and on with some symptomatic control with Imodium.  Patient has GI follow-up scheduled for the next week or so.  Denies any obvious blood in her stool and bowel movements.  Denies any significant abdominal pain or tenderness, nausea, vomiting.  Feels that majority of food that she tries to eat will somehow cause irritation in her stomach.  Not currently on any treatment regimen for Crohn's disease to reduce the chance of flareups.  No recent changes or additions in medications or supplements that she takes.   Diarrhea      Home Medications Prior to Admission medications   Medication Sig Start Date End Date Taking? Authorizing Provider  acetaminophen (TYLENOL) 325 MG tablet Take 650 mg by mouth every 6 (six) hours as needed.    [provider]  cholecalciferol (VITAMIN D3) 25 MCG (1000 UT) tablet Take 1,000 Units by mouth daily.    [provider]  clobetasol ointment (TEMOVATE) 0.05 %  01/05/20   [provider]  escitalopram (LEXAPRO) 10 MG tablet Take 1 tablet by mouth daily. 06/06/16   [provider]  estradiol (ESTRACE) 0.1 MG/GM vaginal cream estradiol 0.01% (0.1 mg/gram) vaginal cream  INSERT 0.5 GRAM VAGINALLY 2 TIMES EVERY WEEK 01/10/20   [provider]  famotidine (PEPCID) 20 MG tablet Take 1 tablet (20 mg total) by mouth daily. 03/12/22   Irene Shipper, MD  levothyroxine (SYNTHROID, LEVOTHROID) 112 MCG tablet Take 1 tablet by mouth daily.    [provider]  Magnesium Oxide 250 MG TABS Take by mouth daily.    [provider]  Multiple Vitamin (MULTIVITAMIN) tablet Take 1 tablet by mouth daily.    [provider]  omeprazole (PRILOSEC) 20 MG capsule Take one tablet ('20mg'$ ) by mouth once or twice daily 06/21/19   Armbruster, Carlota Raspberry, MD  OVER THE COUNTER MEDICATION Vitamin E, one capsule daily.    [provider]  pravastatin (PRAVACHOL) 20 MG tablet Take 20 mg by mouth daily.    [provider]  triamcinolone ointment (KENALOG) 0.1 % Apply 1 application topically 2 (two) times daily. 02/04/21   Valentina Shaggy, MD  vitamin B-12 (CYANOCOBALAMIN) 1000 MCG tablet Take 1,000 mcg by mouth daily.    [provider]      Allergies    Patient has no known allergies.    Review of Systems   Review of Systems  Gastrointestinal:  Positive for diarrhea. Negative for anal bleeding and blood in stool.  All other systems reviewed and are negative.   Physical Exam Updated Vital Signs BP 138/69 (BP Location: Right Arm)   Pulse 66   Temp 97.8 F (36.6 C) (Oral)   Resp 18   Ht '5\' 8"'$  (1.727 m)   Wt 58.1 kg   SpO2 98%   BMI 19.46 kg/m  Physical Exam Vitals and nursing note reviewed.  Constitutional:      Appearance: Normal appearance.  HENT:     Head: Normocephalic and atraumatic.  Cardiovascular:     Rate and Rhythm: Normal rate  and regular rhythm.     Pulses: Normal pulses.     Heart sounds: Normal heart sounds.  Pulmonary:     Effort: Pulmonary effort is normal.     Breath sounds: Normal breath sounds.  Abdominal:     General: Abdomen is flat. Bowel sounds are increased. There is no distension.     Palpations: There is no mass.     Tenderness: There is no abdominal tenderness. There is no guarding.  Skin:    General: Skin is warm and dry.     Capillary Refill: Capillary refill takes less than 2 seconds.  Neurological:     General: No focal deficit present.     Mental Status: She is alert.     ED Results / Procedures / Treatments   Labs (all  labs ordered are listed, but only abnormal results are displayed) Labs Reviewed  GASTROINTESTINAL PANEL BY PCR, STOOL (REPLACES STOOL CULTURE)  CBC WITH DIFFERENTIAL/PLATELET  COMPREHENSIVE METABOLIC PANEL  LIPASE, BLOOD  URINALYSIS, ROUTINE W REFLEX MICROSCOPIC    EKG None  Radiology No results found.  Procedures Procedures   Medications Ordered in ED Medications  lactated ringers bolus 1,000 mL (has no administration in time range)    ED Course/ Medical Decision Making/ A&P                           Medical Decision Making Amount and/or Complexity of Data Reviewed Labs: ordered. Radiology: ordered.   This patient presents to the ED for concern of *** differential diagnosis includes ***    Additional history obtained:  Additional history obtained from *** External records from outside source obtained and reviewed including ***   Lab Tests:  I Ordered, and personally interpreted labs.  The pertinent results include:  ***   Imaging Studies ordered:  I ordered imaging studies including ***  I independently visualized and interpreted imaging which showed *** I agree with the radiologist interpretation   Medicines ordered and prescription drug management:  I ordered medication including ***  for ***  Reevaluation of the patient after these medicines showed that the patient {resolved/improved/worsened:23923::"improved"} I have reviewed the patients home medicines and have made adjustments as needed   Problem List / ED Course:  ***   Social Determinants of Health:     Final Clinical Impression(s) / ED Diagnoses Final diagnoses:  None    Rx / DC Orders ED Discharge Orders     None

## 2022-10-03 NOTE — Discharge Instructions (Addendum)
You were seen in the emergency department for a suspected Crohn's exacerbation. After discussing your care with Dr. Havery Moros, he advised starting you on a Budesonide steroid for the next 30 days with close follow up. Please make sure you can follow up with your GI office in the next week for further evaluation. If needed, please return to the ED for further evaluation. Given that our CT scanner is currently down, I would advise going to another emergency department as you may require CT imaging if your symptoms worsen.

## 2022-10-03 NOTE — Telephone Encounter (Signed)
Spoke with pt and daughter, pt is at [facility] for IV fluids/labs and CT scan. They are wanting sooner appt. Pt scheduled to see Amy Esterwood PA 10/07/22 at 1:30pm. Pt aware of appt.

## 2022-10-03 NOTE — Telephone Encounter (Signed)
Inbound call from patient, stated her symptoms have been getting progressively worse. Requesting sooner appointment and requesting to speak with a nurse to further advise.

## 2022-10-03 NOTE — ED Triage Notes (Signed)
Pt to er, pt states that she is here for diarrhea for the past two weeks, states that she couldn't get into see her gi doc, states that she has a hx of crohn's.

## 2022-10-04 LAB — GASTROINTESTINAL PANEL BY PCR, STOOL (REPLACES STOOL CULTURE)

## 2022-10-05 LAB — URINE CULTURE

## 2022-10-06 ENCOUNTER — Ambulatory Visit: Payer: HMO | Admitting: Physical Therapy

## 2022-10-07 ENCOUNTER — Ambulatory Visit (INDEPENDENT_AMBULATORY_CARE_PROVIDER_SITE_OTHER): Payer: HMO | Admitting: Physician Assistant

## 2022-10-07 ENCOUNTER — Encounter: Payer: Self-pay | Admitting: Physician Assistant

## 2022-10-07 ENCOUNTER — Other Ambulatory Visit (INDEPENDENT_AMBULATORY_CARE_PROVIDER_SITE_OTHER): Payer: HMO

## 2022-10-07 VITALS — BP 108/78 | HR 65 | Ht 68.0 in | Wt 126.0 lb

## 2022-10-07 DIAGNOSIS — R197 Diarrhea, unspecified: Secondary | ICD-10-CM

## 2022-10-07 DIAGNOSIS — K5000 Crohn's disease of small intestine without complications: Secondary | ICD-10-CM | POA: Insufficient documentation

## 2022-10-07 DIAGNOSIS — K219 Gastro-esophageal reflux disease without esophagitis: Secondary | ICD-10-CM | POA: Diagnosis not present

## 2022-10-07 DIAGNOSIS — K50019 Crohn's disease of small intestine with unspecified complications: Secondary | ICD-10-CM

## 2022-10-07 DIAGNOSIS — R109 Unspecified abdominal pain: Secondary | ICD-10-CM | POA: Diagnosis not present

## 2022-10-07 LAB — SEDIMENTATION RATE: Sed Rate: 18 mm/hr (ref 0–30)

## 2022-10-07 LAB — C-REACTIVE PROTEIN: CRP: <1 mg/dL (ref 0.5–20.0)

## 2022-10-07 MED ORDER — BUDESONIDE 3 MG PO CPEP: 9.0000 mg | ORAL_CAPSULE | Freq: Every day | ORAL | 4 refills | Status: AC

## 2022-10-07 MED ORDER — FAMOTIDINE 40 MG PO TABS: 40.0000 mg | ORAL_TABLET | Freq: Every day | ORAL | 6 refills | Status: DC

## 2022-10-07 NOTE — Progress Notes (Signed)
Subjective:    Patient ID: Natalie Carroll, female    DOB: [birth year], 77 y.o.   MRN: [id number]  [name] is a 77 year old white female, established with Dr. Havery Moros, who comes in today after a recent ER visit on 10/03/2022 with severe diarrhea.  She was last seen in the office in August 2023 by Dr. Havery Moros and has diagnosis of suspected Crohn's ileitis, and GERD. She had initially undergone a capsule endoscopy in 2013 while living in [place] and that showed focal areas of active enteritis in the distal jejunum and proximal ileum.  She was apparently treated with a course of Pentasa at that time and also received a course of Entocort/budesonide. Established here in 2018 and underwent colonoscopy in February 2018 which showed 2 small AVMs, the terminal ileum noted to be mildly erythematous but without ulcers and colon was normal.  Biopsies of the colon randomly ruled out microscopic colitis, there was some focal active inflammation on ileal biopsies nonspecific but again question of Crohn's was raised.  Capsule endoscopy was suggested but patient decided not to do that at that time. In 2020 she had sed rate of 34/fecal calprotectin of 413.  She did take an empiric course of budesonide in 2020 which did help with symptoms and she eventually tapered off.  MR enterography December 2020 showed loops of distal ileum with abnormal wall thickening and mucosal enhancement along with some areas of skip lesions favoring active terminal ileitis/Crohn's disease, no abscess, no definite involvement of the stomach, jejunum or colon. Scattered small nonenhancing fluid signal intensity lesions in the liver favoring cyst and an incidental periampullary duodenal diverticulum noted as well as lumbar spondylosis and degenerative disc disease. Dr. Doyne Keel notes, biologic therapy had been discussed in the past and patient declined.  Patient says she had been feeling well with no complaints of abdominal pain or  issues with diarrhea until 2 to 3 weeks ago when she developed diarrhea which gradually worsened over a week to the point that she was concerned about being dehydrated.  She was having at least 7-8 watery bowel movements per day, no blood, and having some nocturnal incontinence of diarrhea.  No documented fever or chills at that time no nausea or vomiting but had not been eating much.  Patient mentions that she had an episode of coughing very hard while she was shopping in a store and after the coughing episode developed some pain in her right flank area and then down into her right abdomen, after that the diarrhea seem to have started.  Prior to that episode she been having some problems with sciatica involving the right SI joint but says the pain in the right flank was a separate issue.  When she was seen in the emergency room on 3 8 she was to have a CT of the abdomen and pelvis however the CT scanner was down that day so she did not have any imaging.  She did have labs including a GI path panel which was done and is negative, this did not include screening for C. difficile.  Labs show WBC of 3.4 hemoglobin 11.7/hematocrit 36/MCV 91 c-Met unremarkable lipase 154, UA with moderate leukocytes and culture showed multiple species  She was started on budesonide per the ER, able to obtain 3 mg tablets much cheaper than a 9 mg tablet and is taking 9 mg total per day currently over the past few days.  Yesterday had 3-4 episodes of diarrhea and had to take Imodium, today  so far has not had any diarrhea and is not complaining of any significant abdominal pain.  She has been eating, eating lower fiber and blander.  She still does not seem to be convinced that she has Crohn's disease, wonders why the inflammation flared up because she has been taking turmeric.    Review of Systems Pertinent positive and negative review of systems were noted in the above HPI section.  All other review of systems was otherwise  negative.   Outpatient Encounter Medications as of 10/07/2022  Medication Sig   acetaminophen (TYLENOL) 325 MG tablet Take 650 mg by mouth every 6 (six) hours as needed.   ASHWAGANDHA PO Take by mouth.   cholecalciferol (VITAMIN D3) 25 MCG (1000 UT) tablet Take 1,000 Units by mouth daily.   clobetasol ointment (TEMOVATE) 0.05 %    escitalopram (LEXAPRO) 10 MG tablet Take 1 tablet by mouth daily.   estradiol (ESTRACE) 0.1 MG/GM vaginal cream estradiol 0.01% (0.1 mg/gram) vaginal cream  INSERT 0.5 GRAM VAGINALLY 2 TIMES EVERY WEEK   famotidine (PEPCID) 40 MG tablet Take 1 tablet (40 mg total) by mouth daily.   levothyroxine (SYNTHROID, LEVOTHROID) 112 MCG tablet Take 1 tablet by mouth daily.   Magnesium Oxide 250 MG TABS Take by mouth daily.   Multiple Vitamin (MULTIVITAMIN) tablet Take 1 tablet by mouth daily.   Multiple Vitamins-Minerals (MULTIVITAMIN ADULTS PO) Take by mouth.   Omega-3 Fatty Acids (OMEGA 3 PO) Take by mouth.   OVER THE COUNTER MEDICATION Vitamin E, one capsule daily.   pravastatin (PRAVACHOL) 20 MG tablet Take 20 mg by mouth daily.   triamcinolone ointment (KENALOG) 0.1 % Apply 1 application topically 2 (two) times daily.   TURMERIC PO Take by mouth.   vitamin B-12 (CYANOCOBALAMIN) 1000 MCG tablet Take 1,000 mcg by mouth daily.   [DISCONTINUED] budesonide (ENTOCORT EC) 3 MG 24 hr capsule Take 3 capsules (9 mg total) by mouth daily.   [DISCONTINUED] Budesonide ER 9 MG TB24 Take 1 tablet (9 mg total) by mouth daily.   [DISCONTINUED] famotidine (PEPCID) 20 MG tablet Take 1 tablet (20 mg total) by mouth daily.   [DISCONTINUED] omeprazole (PRILOSEC) 20 MG capsule Take one tablet ('20mg'$ ) by mouth once or twice daily   budesonide (ENTOCORT EC) 3 MG 24 hr capsule Take 3 capsules (9 mg total) by mouth daily.   No facility-administered encounter medications on file as of 10/07/2022.   No Known Allergies Patient Active Problem List   Diagnosis Date Noted   Crohn's ileitis ([place])  10/07/2022   Precordial chest pain 06/29/2019   Educated about COVID-19 virus infection 06/29/2019   GERD (gastroesophageal reflux disease) 07/31/2016   Hemorrhoids 07/31/2016   Social History   Socioeconomic History   Marital status: Single    Spouse name: Not on file   Number of children: 1   Years of education: Not on file   Highest education level: Not on file  Occupational History   Occupation: Retired  Tobacco Use   Smoking status: Former    Types: Cigarettes    Quit date: 07/28/1989    Years since quitting: 33.2   Smokeless tobacco: Never  Vaping Use   Vaping Use: Never used  Substance and Sexual Activity   Alcohol use: No   Drug use: No   Sexual activity: Not on file  Other Topics Concern   Not on file  Social History Narrative   Not on file   Social Determinants of Radio broadcast assistant  Strain: Not on file  Food Insecurity: Not on file  Transportation Needs: Not on file  Physical Activity: Not on file  Stress: Not on file  Social Connections: Not on file  Intimate Partner Violence: Not on file    Ms. Martis's family history includes Bladder Cancer in her sister; CVA in her sister; Diabetes in her brother and mother; Heart disease in her brother; Heart disease (age of onset: 73) in her mother; Kidney disease in her sister; Ovarian cancer in her sister.      Objective:    Vitals:   10/07/22 1334  BP: 108/78  Pulse: 65  SpO2: 96%    Physical Exam Well-developed well-nourished eld WF  in no acute distress.  Pleasant, accompanied by her daughter , Weight, 126 BMI 19.1  HEENT; nontraumatic normocephalic, EOMI, PE R LA, sclera anicteric. Oropharynx; not examined today Neck; supple, no JVD Cardiovascular; regular rate and rhythm with S1-S2, no murmur rub or gallop Pulmonary; Clear bilaterally Abdomen; soft, there is mild tenderness in the right mid quadrant/right lower quadrant, no guarding or rebound nondistended, no palpable mass or  hepatosplenomegaly, bowel sounds are active Rectal; not done today Skin; benign exam, no jaundice rash or appreciable lesions Extremities; no clubbing cyanosis or edema skin warm and dry Neuro/Psych; alert and oriented x4, grossly nonfocal mood and affect appropriate        Assessment & Plan:   #18 77 year old white female with diagnosis of suspected Crohn's ileitis, as outlined above with previous MR enterography December 2020 consistent with Crohn's ileitis, and prior ileal biopsy from colonoscopy in 2018 showed nonspecific inflammation of the ileum, colon appeared normal and random biopsies negative. She has been treated with budesonide in the past with improvement in symptoms, has not been on any medication over the past 2 to 3 years.  Now with worsening of diarrhea over the past 2 to 3 weeks, at worst 7-8 watery stools per day, nocturnal incontinence, lower abdominal cramping. ER visit 10/03/2022, no imaging done as CT scanner was down.  GI path panel negative and labs otherwise reassuring  Started budesonide 4 days ago 9 mg total per day with some improvement in symptoms.  Suspect she has had an exacerbation of Crohn's ileitis, will rule out C. Difficile-other infectious evaluation negative with negative GI path panel  #2 very mild anemia normocytic #3 GERD-chronic patient mentions that she like to come off of omeprazole and has been trying famotidine 40 mg once daily. #4 history of hypothyroid #5.  Hyperlipidemia  Plan ; check sed rate, CRP, and fecal calprotectin will also check QuantiFERON gold Schedule for CT of the abdomen and pelvis with contrast On discussion with patient and daughter today regarding Crohn's ileitis. Continue budesonide 3 mg tablets, total of 9 mg once daily.  Advised that she may need this dose for 4 to 6 weeks and then depending on her symptoms we can gradually taper. We discussed potential need for escalation of therapy if budesonide does not control  symptoms and of course depending on CT findings. For now we will plan to stop omeprazole and she can continue famotidine 40 mg once daily AC dinner.  Advised that if this does not control her symptoms she can go back to low-dose omeprazole at 20 mg daily. Low roughage bland diet until symptoms significantly improved  Patient will be scheduled for follow-up with Dr. Havery Moros, I am happy to see her in the interim after we have results from today's studies and CT.  Alfredia Ferguson PA-C 10/07/2022   Cc: Katherina Mires, MD

## 2022-10-07 NOTE — Patient Instructions (Addendum)
Stop Omeprazole.   Start Pepcid 40 mg - Take 1 tablet by mouth once daily.   Continue Budesonide as directed.   We have sent the following medications to your pharmacy for you to pick up at your convenience: Budesonide, Pepcid   You have been scheduled for a CT scan of the abdomen and pelvis at [facility] are scheduled on 10/08/22 at 7:00 pm .  Please follow the written instructions below on the day of your exam:   1) Do not eat anything after 3:00 pm (4 hours prior to your test)    You may take any medications as prescribed with a small amount of water, if necessary. If you take any of the following medications: METFORMIN, GLUCOPHAGE, GLUCOVANCE, AVANDAMET, RIOMET, FORTAMET, [place] MET, JANUMET, GLUMETZA or METAGLIP, you MAY be asked to HOLD this medication 48 hours AFTER the exam.   The purpose of you drinking the oral contrast is to aid in the visualization of your intestinal tract. The contrast solution may cause some diarrhea. Depending on your individual set of symptoms, you may also receive an intravenous injection of x-ray contrast/dye. Plan on being at [facility] for 45 minutes or longer, depending on the type of exam you are having performed.   If you have any questions regarding your exam or if you need to reschedule, you may call [facility]  at [phone] between the hours of 8:00 am and 5:00 pm, Monday-Friday.   Your provider has requested that you go to the basement level for lab work before leaving today. Press "B" on the elevator. The lab is located at the first door on the left as you exit the elevator.   Thank you for choosing me and [facility].  Amy Esterwood PA-C

## 2022-10-07 NOTE — Progress Notes (Signed)
Agree with assessment and plan as outlined.  I am fairly certain she has Crohn's disease given her workup in the past, I am surprised she is questioning that diagnosis given her history.  She is doing better with budesonide and I agree with giving her course of that, can see her back in the office for reassessment after workup.  If she has any worsening in the interim despite budesonide she should let us know.  Thanks

## 2022-10-08 ENCOUNTER — Ambulatory Visit: Payer: HMO

## 2022-10-08 ENCOUNTER — Ambulatory Visit (HOSPITAL_BASED_OUTPATIENT_CLINIC_OR_DEPARTMENT_OTHER)
Admission: RE | Admit: 2022-10-08 | Discharge: 2022-10-08 | Disposition: A | Discharge status: Home / Self Care | Payer: HMO | Source: Ambulatory Visit | Attending: Physician Assistant | Admitting: Physician Assistant

## 2022-10-08 DIAGNOSIS — K219 Gastro-esophageal reflux disease without esophagitis: Secondary | ICD-10-CM | POA: Insufficient documentation

## 2022-10-08 DIAGNOSIS — K50019 Crohn's disease of small intestine with unspecified complications: Secondary | ICD-10-CM | POA: Insufficient documentation

## 2022-10-08 DIAGNOSIS — R197 Diarrhea, unspecified: Secondary | ICD-10-CM | POA: Insufficient documentation

## 2022-10-08 DIAGNOSIS — R109 Unspecified abdominal pain: Secondary | ICD-10-CM | POA: Diagnosis present

## 2022-10-08 MED ORDER — IOHEXOL 300 MG/ML  SOLN
100.0000 mL | Freq: Once | INTRAMUSCULAR | Status: AC | PRN
  Administered 2022-10-08: 100 mL via INTRAVENOUS

## 2022-10-09 ENCOUNTER — Other Ambulatory Visit: Payer: HMO

## 2022-10-09 DIAGNOSIS — R109 Unspecified abdominal pain: Secondary | ICD-10-CM

## 2022-10-09 DIAGNOSIS — K219 Gastro-esophageal reflux disease without esophagitis: Secondary | ICD-10-CM

## 2022-10-09 DIAGNOSIS — K50019 Crohn's disease of small intestine with unspecified complications: Secondary | ICD-10-CM

## 2022-10-09 DIAGNOSIS — R197 Diarrhea, unspecified: Secondary | ICD-10-CM

## 2022-10-09 LAB — QUANTIFERON-TB GOLD PLUS
Mitogen-NIL: 1.85 IU/mL
NIL: 0.02 IU/mL
QuantiFERON-TB Gold Plus: NEGATIVE
TB1-NIL: 0.01 IU/mL
TB2-NIL: 0 IU/mL

## 2022-10-10 ENCOUNTER — Telehealth: Payer: Self-pay | Admitting: Physician Assistant

## 2022-10-10 LAB — CLOSTRIDIUM DIFFICILE TOXIN B, QUALITATIVE, REAL-TIME PCR: Toxigenic C. Difficile by PCR: NOT DETECTED

## 2022-10-10 NOTE — Telephone Encounter (Signed)
Inbound call from patient, is calling in regards to CT results. She was told at [facility] that she had to call our office and tell someone to read the results in order for them to be released to her. Patient is anxiously waiting to speak with a nurse in regards.

## 2022-10-10 NOTE — Telephone Encounter (Signed)
Spoke with patient. Advised the results are pending. We will contact her once results from 10/08/22 CT are available and reviewed by the provider.

## 2022-10-13 ENCOUNTER — Encounter: Payer: Self-pay | Admitting: Physical Therapy

## 2022-10-13 ENCOUNTER — Ambulatory Visit: Payer: HMO | Admitting: Physical Therapy

## 2022-10-13 DIAGNOSIS — M545 Low back pain, unspecified: Secondary | ICD-10-CM | POA: Diagnosis not present

## 2022-10-13 DIAGNOSIS — M5459 Other low back pain: Secondary | ICD-10-CM

## 2022-10-13 DIAGNOSIS — M25551 Pain in right hip: Secondary | ICD-10-CM

## 2022-10-13 DIAGNOSIS — M6281 Muscle weakness (generalized): Secondary | ICD-10-CM

## 2022-10-13 NOTE — Therapy (Signed)
OUTPATIENT PHYSICAL THERAPY THORACOLUMBAR Treatment   Patient Name: Natalie Carroll MRN: [id number] DOB:08-Mar-1946, 77 y.o., female Today's Date: 10/13/2022  END OF SESSION:     Past Medical History:  Diagnosis Date   Allergy    Angio-edema    Cancer ([place])    Skin cancer   Colon polyp    GERD (gastroesophageal reflux disease)    Hyperlipidemia    Hypothyroidism    Ileitis    Osteopenia    Sleep apnea    Urticaria    Past Surgical History:  Procedure Laterality Date   BREAST BIOPSY     left breast   SKIN CANCER EXCISION     THYROIDECTOMY, PARTIAL  1991   TONSILLECTOMY     TUBAL LIGATION     Patient Active Problem List   Diagnosis Date Noted   Crohn's ileitis ([place]) 10/07/2022   Precordial chest pain 06/29/2019   Educated about COVID-19 virus infection 06/29/2019   GERD (gastroesophageal reflux disease) 07/31/2016   Hemorrhoids 07/31/2016    PCP: Suzanna Obey  REFERRING PROVIDER: Earlyne Iba  REFERRING DIAG:   M79.18 (ICD-10-CM) - Myalgia, other site  M70.71 (ICD-10-CM) - Other bursitis of hip, right hip    Rationale for Evaluation and Treatment: Rehabilitation  THERAPY DIAG:  Chronic right-sided low back pain without sciatica  Muscle weakness (generalized)  Other low back pain  Pain in right hip  ONSET DATE: 09/10/22  SUBJECTIVE:                                                                                                                                                                                           SUBJECTIVE STATEMENT: Im hurting, Having some pain in the R flank. R flank pain has over shadowed the hip pain.    PERTINENT HISTORY:  No remarkable history  PAIN:  Are you having pain? Yes: NPRS scale: 8/10 Pain location: R side low back and hip Pain description: sharp, shooting, annoying, nagging Aggravating factors: with prolonged sitting,  Relieving factors: sometimes when she exercises  PRECAUTIONS: None  WEIGHT BEARING  RESTRICTIONS: No  FALLS:  Has patient fallen in last 6 months? No  LIVING ENVIRONMENT: Lives with: lives with their family Lives in: House/apartment Stairs: No  OCCUPATION: Retired  PLOF: Independent  PATIENT GOALS: to get rid of pain   OBJECTIVE:   DIAGNOSTIC FINDINGS:  None for hip and back   SCREENING FOR RED FLAGS: Bowel or bladder incontinence: No Spinal tumors: No Cauda equina syndrome: No Compression fracture: No Abdominal aneurysm: No  COGNITION: Overall cognitive status: Within functional limits for tasks assessed     SENSATION: [facility]  MUSCLE LENGTH: Hamstrings: mild tightness in BLE  POSTURE: rounded shoulders and flexed trunk   PALPATION: TTP SIJ   LUMBAR ROM:   AROM eval  Flexion WFL  Extension WFL  Right lateral flexion 50% with pain in back and hip  Left lateral flexion 75% no pain just tight  Right rotation WFL  Left rotation WFL   (Blank rows = not tested)  LOWER EXTREMITY ROM:  grossly WFL  LOWER EXTREMITY MMT:    MMT Right eval Left eval  Hip flexion 3+ 4+  Hip extension 4- 4-  Hip abduction 4 4+  Hip adduction    Hip internal rotation    Hip external rotation    Knee flexion 5 5  Knee extension 5 5  Ankle dorsiflexion    Ankle plantarflexion    Ankle inversion    Ankle eversion     (Blank rows = not tested)  LUMBAR SPECIAL TESTS:  Straight leg raise test: Negative  FUNCTIONAL TESTS:  5 times sit to stand: 16.11s  GAIT: Distance walked: in clinic distances Assistive device utilized: None Level of assistance: Modified independence Comments: antalgic gait, circumducts RLE a little bit, flexed at trunk, decreased step length with RLE  TODAY'S TREATMENT:    10/13/22 Bike L2 x 4 min  NuStep L4 x 4 min Supine bridges x10 LE on pball bridges, oblq, K2C,   LE piriformis,HS, K2C,double K2C stretch Lower trunk rotations  Shoulder Ext red 2x10 Pre Mod R low back R scapula x 10 min for pain to pt  tolerance  10/01/22: Manual:  L side lying for cross friction massage R gluteus medius and minimus and post Si ligaments on R Brief SI assessment cluster, + R thigh thrust, + distraction and compression on R.  Therefore manually assisted with supine muscle energy correction for SI  alignment, with 5 sec holds, 3 reps each leg with resisted hip and knee extension.  Therex:  Patient instructed in SI jt self correction with resistance provided from hands , same technique as above in supine,alternating with 5 sec holds, 3 reps each side Instructed in additional supine stabilization ex for lumbosacral region: -hooklying hip add, and abd with ball between knees and strap around knees, 10 sec holds, 10 reps -seated alternating isometric hip and knee ext to align pelvis 3 x each, 5 sec -seated hip isometric abd with strap 10 sec holds, 5 reps each -standing penguins, with green t band around thighs, for dynamic lateral hip strengthening, performed until fatigue, for 30 sec.  Noted increased B genu valgum trunk sway  with this activity.                                                                                                                         DATE: EVAL- 09/25/22    PATIENT EDUCATION:  Education details: HEP and POC Person educated: Patient Education method: Explanation Education comprehension: verbalized understanding  HOME EXERCISE PROGRAM: Access Code: [email or web address] URL: [email or web address] Date: 10/01/2022 Prepared  by: Hayden Pedro  Exercises - Hooklying Isometric Hip Abduction Adduction with Belt and Ball  - 1 x daily - 7 x weekly - 1 sets - 10 reps - 90/90 SI Joint Self-Correction with Dowel  - 1 x daily - 7 x weekly - 3 sets - 10 reps - Side Stepping with Resistance at Ankles  - 1 x daily - 7 x weekly - 3 sets - 10 reps - Seated Isometric Hip Abduction with Belt  - 1 x daily - 7 x weekly - 3 sets - 10 reps  Access Code: [email or web address] URL:  [email or web address] Date: 09/25/2022 Prepared by: Andris Baumann  Exercises - Supine Bridge  - 1 x daily - 7 x weekly - 2 sets - 10 reps - Supine Lower Trunk Rotation  - 1 x daily - 7 x weekly - 2 sets - 10 reps - Clamshell with Resistance  - 1 x daily - 7 x weekly - 2 sets - 10 reps - Supine Figure 4 Piriformis Stretch  - 2 x daily - 7 x weekly - 30 hold  ASSESSMENT:  CLINICAL IMPRESSION: Patient is a 77 y.o. female who was seen today for physical therapy treatment for low back and R lat hip pain.  She stated activity does improve her symptoms at times. Pt reports a good stretch with HS, K2C, and lower trunk rotations helping with the pain.  Cue to slow down with bridging interventions. Cues to keep shoulders down with shoulder Ext.   OBJECTIVE IMPAIRMENTS: Abnormal gait, difficulty walking, decreased strength, and pain.   ACTIVITY LIMITATIONS: sitting, standing, and locomotion level  PARTICIPATION LIMITATIONS: cleaning, shopping, and community activity  REHAB POTENTIAL: Good  CLINICAL DECISION MAKING: Stable/uncomplicated  EVALUATION COMPLEXITY: Low   GOALS: Goals reviewed with patient? Yes  SHORT TERM GOALS: Target date: 10/30/22  Patient will be independent with initial HEP.  Goal status: IN PROGRESS  4.  Patient will demonstrate improved functional strength as demonstrated by 5xSTS <12. Baseline: 16.11s Goal status: INITIAL   LONG TERM GOALS: Target date: 12/04/22  Patient will be independent with advanced/ongoing HEP to improve outcomes and carryover.  Goal status: IN PROGRESS  2.  Patient will report 75% improvement in low back and R hip pain to improve QOL.  Baseline: 6/10 constant, 10/10 when it hurts Goal status: IN PROGRESS  3.  Patient will demonstrate full pain free lumbar ROM to perform ADLs.   Goal status: IN PROGRESS  6.  Patient will tolerate 30 min of (standing/sitting/walking) to perform household chores. Baseline: pain after sitting  or standing 5-3mins Goal status: IN PROGRESS   PLAN:  PT FREQUENCY: 1-2x/week  PT DURATION: 10 weeks  PLANNED INTERVENTIONS: Therapeutic exercises, Therapeutic activity, Neuromuscular re-education, Balance training, Gait training, Patient/Family education, Self Care, Joint mobilization, Stair training, Dry Needling, Electrical stimulation, Spinal manipulation, Spinal mobilization, Cryotherapy, Moist heat, Traction, Ionotophoresis 4mg /ml Dexamethasone, and Manual therapy.  PLAN FOR NEXT SESSION: light gym activities for low back and hip as tolerated, pain management (e-stim and heat, maybe some STM)   Scot Jun, PTA 10/13/2022, 11:45 AM

## 2022-10-14 ENCOUNTER — Ambulatory Visit: Payer: HMO | Admitting: Nurse Practitioner

## 2022-10-15 ENCOUNTER — Ambulatory Visit: Payer: HMO

## 2022-10-15 LAB — CALPROTECTIN, FECAL: Calprotectin, Fecal: 361 ug/g — ABNORMAL HIGH (ref 0–120)

## 2022-10-20 ENCOUNTER — Ambulatory Visit: Payer: HMO

## 2022-10-20 ENCOUNTER — Encounter: Payer: Self-pay | Admitting: Gastroenterology

## 2022-10-22 ENCOUNTER — Ambulatory Visit: Payer: HMO | Admitting: Physical Therapy

## 2022-10-30 ENCOUNTER — Telehealth: Payer: Self-pay | Admitting: Physician Assistant

## 2022-10-30 NOTE — Telephone Encounter (Signed)
Called patient. No answer. Left voicemail of my call.

## 2022-10-30 NOTE — Telephone Encounter (Signed)
Patient is calling to follow up on recent MyChart message, states she has concerns regarding Budenoside Rx. Please advise

## 2022-10-31 NOTE — Telephone Encounter (Signed)
Spoke with the patient. She is taking Budesonide 9 mg daily. She will have "shards" of stool pass urgently at least 3 times a day. The bowel movement is preceded by abdominal pain "like I am going to have diarrhea."  She was last seen 10/07/22.

## 2022-10-31 NOTE — Telephone Encounter (Signed)
PT returning call. Please advise. 

## 2022-11-03 NOTE — Telephone Encounter (Signed)
Agree with adding bentyl. Last I spoke with her via Mychart her diarrhea was improved. If tolerable would continue 9mg  budesonide per day. If worsening despite this or not getting better agree that prednisone would be next step. Hopefully bentyl can help in the interim, if not she should call us back. Thanks

## 2022-11-04 MED ORDER — DICYCLOMINE HCL 10 MG PO CAPS: 10.0000 mg | ORAL_CAPSULE | Freq: Three times a day (TID) | ORAL | 1 refills | Status: DC

## 2022-11-04 NOTE — Telephone Encounter (Signed)
Spoke with the patient. She agrees to stay on budesonide 9 mg for another week and add dicyclomine 1 PO 30 minutes before meal.  She wants to talk again in a week for follow up.

## 2022-11-06 ENCOUNTER — Ambulatory Visit: Payer: HMO | Admitting: Nurse Practitioner

## 2022-11-11 NOTE — Telephone Encounter (Signed)
Called the patient. She said "I am better. In a week I will try weaning down on the budesonide."  No further issues at this time.

## 2022-11-25 ENCOUNTER — Encounter: Payer: Self-pay | Admitting: Gastroenterology

## 2022-12-05 ENCOUNTER — Encounter: Payer: Self-pay | Admitting: Cardiology

## 2022-12-05 ENCOUNTER — Encounter: Payer: Self-pay | Admitting: Gastroenterology

## 2022-12-05 ENCOUNTER — Ambulatory Visit (INDEPENDENT_AMBULATORY_CARE_PROVIDER_SITE_OTHER): Payer: HMO | Admitting: Gastroenterology

## 2022-12-05 ENCOUNTER — Other Ambulatory Visit: Payer: Self-pay | Admitting: Gastroenterology

## 2022-12-05 VITALS — BP 118/68 | HR 80 | Ht 68.0 in | Wt 121.0 lb

## 2022-12-05 DIAGNOSIS — K649 Unspecified hemorrhoids: Secondary | ICD-10-CM

## 2022-12-05 DIAGNOSIS — K50018 Crohn's disease of small intestine with other complication: Secondary | ICD-10-CM | POA: Diagnosis not present

## 2022-12-05 NOTE — Patient Instructions (Addendum)
Please follow a low residue diet. Handout has been given to you today.  Please discontinue probiotics.  Continue Budesonide 3 mg daily x 1 week, then discontinue.  Visit [email or web address] to learn more about Entyvio and [employer].  _______________________________________________________  If your blood pressure at your visit was 140/90 or greater, please contact your primary care physician to follow up on this.  _______________________________________________________  If you are age 64 or older, your body mass index should be between 23-30. Your Body mass index is 18.4 kg/m. If this is out of the aforementioned range listed, please consider follow up with your Primary Care Provider.  If you are age 27 or younger, your body mass index should be between 19-25. Your Body mass index is 18.4 kg/m. If this is out of the aformentioned range listed, please consider follow up with your Primary Care Provider.   ________________________________________________________  The [place] [facility] providers would like to encourage you to use [facility] to communicate with providers for non-urgent requests or questions.  Due to long hold times on the telephone, sending your provider a message by [facility] may be a faster and more efficient way to get a response.  Please allow 48 business hours for a response.  Please remember that this is for non-urgent requests.  _______________________________________________________  Due to recent changes in healthcare laws, you may see the results of your imaging and laboratory studies on MyChart before your provider has had a chance to review them.  We understand that in some cases there may be results that are confusing or concerning to you. Not all laboratory results come back in the same time frame and the provider may be waiting for multiple results in order to interpret others.  Please give [place] 48 hours in order for your provider to thoroughly review all the  results before contacting the office for clarification of your results.

## 2022-12-05 NOTE — Progress Notes (Signed)
HPI :  77 year old female here for a follow-up visit for suspected Crohn's disease. I last saw her in 02/2022 and she was seen by Mike Gip in 09/2022.   Recall she has a suspected Crohn's disease based off work-up as below, listed in chronological order.   Initial workup done in [facility] - Dr. Bernarda Caffey CT enterography in 2011 showed focal ileitis IBD serology panel NEGATIVE   01/07/2012 - colonoscopy showed nonerosive ileitis to the distal ileum, normal colon - path not in records 01/07/2012 - EGD showed mild gastritis, normal duodenum   02/24/2012 apsule endoscopy showing focal areas of active enteritis in the distal jejunum / proximal ileum. Treated with Pentasa at the time. Also received a course of Enterocort   Colonoscopy in February 2018 with me, 2 small AVMs, the terminal ileum was noted to be mildly erythematous but without ulcers and colon was normal.  Biopsies were done to rule out microscopic colitis and these were negative.  There was some focal active inflammation on ileal biopsies, nonspecific.  Question of Crohn's was again raised.  Capsule endoscopy was suggested but she decided not to follow through with that at that time.     Labs obtained in 2020 during symptoms of diarrhea, as below: ESR 34 Fecal calprotectin 413   She was given an empiric course of budesonide previously which provided some benefit for her.  She eventually tapered off this.  We performed a follow-up MR enterography to further assess for active inflammation of her bowel and restage things   MRE 07/26/19 -  IMPRESSION: 1. Loops of distal ileum demonstrate abnormal wall thickening and mucosal enhancement along with some small areas of skip lesions, favoring active terminal ileitis/Crohn's disease. No abscess is identified. No definite involvement of the stomach, jejunum, or colon. 2. Prominence of stool in the colon as can be encountered in the setting of constipation; no current colonic  air-fluid levels are identified to favor active diarrheal process. 3. Scattered small nonenhancing fluid signal intensity lesions in the liver favoring cysts. 4. 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 5.  Aortic Atherosclerosis (ICD10-I70.0). 6. Lumbar spondylosis and degenerative disc disease.   I had previously discussed options with her, she was not interested in biologic therapy and preferred monitoring.  She presented again in March with a recurrent flare of her symptoms.  She states this was more although worst flare she has had in some time.  In fact she had been doing well for 2 years she reports until she had a flare at the end of February.  Symptoms were right lower quadrant pain with loose stools.  CT scan was performed as outlined below, active inflammation consistent with Crohn's disease.  She was resumed on budesonide with a taper and she states it took some time to get better but she did resolve it and has been feeling much better lately.  She feels a lot of gas and bloating at baseline but otherwise her pain is gone.  Her bowels are back to normal, no urgency.  No blood in her stools.  She has tapered down and is now on budesonide 3 mg a day for the past 2 weeks.  She has been taking turmeric as well has probiotics.  Bentyl is listed in her chart but she states she does not have that.  No NSAID use.  Takes Tylenol as needed.  She states her hemorrhoids got worse during the diarrhea an that was inflamed and bothering her.  This  is got better with time but still bothers her  periodically.    EGD 07/12/19 -  - The exam of the esophagus was normal, no hiatal hernia. - A few 3 mm sessile polyps were found in the gastric body. A few were removed with cold forceps for histology for sampling, suspect benign fundic gland polyps in the setting of PPI use. - The exam of the stomach was otherwise normal. Retroflexed views of the cardia show Hill grade I. -  The duodenal bulb and second portion of the duodenum were normal.   Barium swallow 07/27/19 - normal study - no reflux seen      CT abdomen / pelvis with contrast 10/11/2022 -  IMPRESSION: 1. Signs of mild inflammatory enteritis involving right lower quadrant small bowel loops with areas of mild wall thickening, mucosal enhancement and fecalized bowel contents. A few out areas of direct caliber change noted of bowel vein the affected bowel loops which may reflect underlying stricture. 2. No signs of penetrating disease, abscess or bowel obstruction. 3. Right lower quadrant ileocolic lymph nodes are prominent measuring up to 8 mm, likely reactive. 4.  Aortic Atherosclerosis (ICD10-I70.0).  Fecal calprotectin 361 on 10/09/22 C diff negative    Past Medical History:  Diagnosis Date   Allergy    Angio-edema    Cancer (HCC)    Skin cancer   Colon polyp    GERD (gastroesophageal reflux disease)    Hyperlipidemia    Hypothyroidism    Ileitis    Osteopenia    Sleep apnea    Urticaria      Past Surgical History:  Procedure Laterality Date   BREAST BIOPSY     left breast   SKIN CANCER EXCISION     THYROIDECTOMY, PARTIAL  1991   TONSILLECTOMY     TUBAL LIGATION     Family History  Problem Relation Age of Onset   Diabetes Mother    Heart disease Mother 33       Stents   Ovarian cancer Sister    Kidney disease Sister    Bladder Cancer Sister    CVA Sister    Diabetes Brother    Heart disease Brother        Valve surgery   Colon cancer Neg Hx    Esophageal cancer Neg Hx    Rectal cancer Neg Hx    Stomach cancer Neg Hx    Allergic rhinitis Neg Hx    Asthma Neg Hx    Eczema Neg Hx    Urticaria Neg Hx    Social History   Tobacco Use   Smoking status: Former    Types: Cigarettes    Quit date: 07/28/1989    Years since quitting: 33.3   Smokeless tobacco: Never  Vaping Use   Vaping Use: Never used  Substance Use Topics   Alcohol use: No   Drug use: No    Current Outpatient Medications  Medication Sig Dispense Refill   acetaminophen (TYLENOL) 325 MG tablet Take 650 mg by mouth every 6 (six) hours as needed.     ASHWAGANDHA PO Take by mouth.     budesonide (ENTOCORT EC) 3 MG 24 hr capsule Take 3 capsules (9 mg total) by mouth daily. 90 capsule 4   cholecalciferol (VITAMIN D3) 25 MCG (1000 UT) tablet Take 1,000 Units by mouth daily.     clobetasol ointment (TEMOVATE) 0.05 %      dicyclomine (BENTYL) 10 MG capsule Take 1 capsule (10 mg  total) by mouth 3 (three) times daily before meals. 90 capsule 1   escitalopram (LEXAPRO) 10 MG tablet Take 1 tablet by mouth daily.     estradiol (ESTRACE) 0.1 MG/GM vaginal cream estradiol 0.01% (0.1 mg/gram) vaginal cream  INSERT 0.5 GRAM VAGINALLY 2 TIMES EVERY WEEK     levothyroxine (SYNTHROID, LEVOTHROID) 112 MCG tablet Take 1 tablet by mouth daily.     Magnesium Oxide 250 MG TABS Take by mouth daily.     Multiple Vitamin (MULTIVITAMIN) tablet Take 1 tablet by mouth daily.     Multiple Vitamins-Minerals (MULTIVITAMIN ADULTS PO) Take by mouth.     Omega-3 Fatty Acids (OMEGA 3 PO) Take by mouth.     omeprazole (PRILOSEC) 20 MG capsule 1-2 capsule 30 minutes before morning meal Orally Once a day     OVER THE COUNTER MEDICATION Vitamin E, one capsule daily.     pravastatin (PRAVACHOL) 20 MG tablet Take 20 mg by mouth daily.     triamcinolone ointment (KENALOG) 0.1 % Apply 1 application topically 2 (two) times daily. 454 g 3   TURMERIC PO Take by mouth.     vitamin B-12 (CYANOCOBALAMIN) 1000 MCG tablet Take 1,000 mcg by mouth daily.     famotidine (PEPCID) 40 MG tablet Take 1 tablet (40 mg total) by mouth daily. (Patient not taking: Reported on 12/05/2022) 30 tablet 6   No current facility-administered medications for this visit.   No Known Allergies   Review of Systems: All systems reviewed and negative except where noted in HPI.   Lab Results  Component Value Date   WBC 3.4 (L) 10/03/2022   HGB  11.7 (L) 10/03/2022   HCT 36.0 10/03/2022   MCV 91.1 10/03/2022   PLT 190 10/03/2022    Lab Results  Component Value Date   CREATININE 0.82 10/03/2022   BUN 17 10/03/2022   NA 135 10/03/2022   K 4.4 10/03/2022   CL 101 10/03/2022   CO2 29 10/03/2022    Lab Results  Component Value Date   ALT 15 10/03/2022   AST 24 10/03/2022   ALKPHOS 89 10/03/2022   BILITOT 0.6 10/03/2022    Lab Results  Component Value Date   ESRSEDRATE 18 10/07/2022    Lab Results  Component Value Date   CRP <1.0 10/07/2022     Physical Exam: BP 118/68   Pulse 80   Ht 5\' 8"  (1.727 m)   Wt 121 lb (54.9 kg)   BMI 18.40 kg/m  Constitutional: Pleasant,well-developed, female in no acute distress. Skin: Skin is warm and dry. No rashes noted. Psychiatric: Normal mood and affect. Behavior is normal.   ASSESSMENT: 77 y.o. female here for assessment of the following  1. 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 Hemorrhoids, unspecified hemorrhoid type    I reviewed her history with her.  I think the diagnosis of small bowel Crohn's disease is accurate based on her course to date.  She has historically been treated with budesonide as needed for flares which works for her but counseled her this does not prevent future flares, and ongoing active inflammation of her bowel could lead to stricturing, and complications from Crohn's disease to include obstruction, fistula, surgery etc.  Previously she has wanted to avoid biologic therapy however given disease burden noted on imaging and her symptoms, I think this is reasonable to pursue at this point in hopes of controlling this and reducing chances for future flares.  Without maintenance therapy  her risk for recurrent symptoms is quite high.  Fortunately she has responded well to budesonide and I think we can continue to taper that, she will take 3 mg a day for another week and stop.  I do not think probiotics are providing any benefit in  this situation and with her gas and bloating I think she can stop that as well.  She had questions about dietary measures she can take.  Certainly when she is flaring I would recommend a low residue diet.  She can start adding foods back slowly as she tolerates.  In regards to biologic therapy we discussed several options.  We reviewed anti-TNF's, thiopurine's, Entyvio, and the newer agents such as Skyrizi, Rinvoq etc.  Although all of these, given her age, I think Thompson Grayer is the safest option and may be reasonable to start with that.  Gave her some information about this and she will read further about it on the [facility] a website.  I otherwise think Cristy Folks is also an excellent option for initial therapy if she wanted to pursue that, although with slightly more risk for infection.  Both very safe.  Her TB testing is negative.  Following discussion of options I think her preference would be Entyvio given safety profile.  Will place orders and obtain prior authorization to see if covered by her insurance and if so she can decide if she wants to pursue this or not.  If she declines biologic therapy she should have budesonide on hand at home to use for flares as needed.  She should follow-up in a few months for reassessment regardless of her decision for maintenance therapy.   PLAN: - low residue diet handout - for flares - stop probiotic - continue budesonide 3mg  / day for one week and stop - discussed biologic therapy. Recommend she go to the [facility] website for more information about drugs - Entyvio and [employer] - place orders for prior authorization for Entyvio - has budesonide to use for flares PRN - follow up in 3-4 months or call sooner in the interim with any issues.  Harlin Rain, MD [facility]

## 2022-12-11 ENCOUNTER — Telehealth: Payer: Self-pay | Admitting: Pharmacy Technician

## 2022-12-11 NOTE — Telephone Encounter (Addendum)
Dr. Adela Lank, [facility] note:  Patient will be scheduled as soon as possible Auth Submission: APPROVED Site of care: Site of care: [facility] Payer: [employer] ADVT Medication & CPT/J Code(s) submitted: Entyvio (Vedolizumab) J3380 Route of submission (phone, fax, portal):  Phone # Fax # Auth type: Buy/Bill Units/visits requested: 4 DOSES Reference number: [id number] Approval from: 12/11/22 to 03/13/23   Patient has not met OOP deductible and will need PAP (Free drug) [employer] has been notified.  PAP: PENDING Approval date:  Id:

## 2022-12-11 NOTE — Telephone Encounter (Signed)
Noted, thanks!

## 2022-12-17 NOTE — Telephone Encounter (Signed)
Patient has not met OOP deductible and will need PAP (Free drug) assistance.   [employer] has been notified. Once patient is approved and medication is received, patient will be scheduled.

## 2023-01-08 NOTE — Telephone Encounter (Signed)
-----   Message ----- From: Amalia Greenhouse, CPhT Sent: 12/19/2022  12:08 PM EDT To: Missy Sabins, RN  I have emailed [employer] to inform them to enroll patient.  I will f/u once I have a response. Thanks Kim ----- Message ----- From: Missy Sabins, RN Sent: 12/19/2022  11:32 AM EDT To: Amalia Greenhouse, CPhT  Hi Kim, has the PAP application already been initiated?

## 2023-01-08 NOTE — Telephone Encounter (Signed)
Natalie Carroll-  Any way you could see if Natalie Carroll has finished enrolling patient in the assistance program for Entyvio?

## 2023-01-19 NOTE — Telephone Encounter (Signed)
Natalie Carroll, I have sent an f/u email to Natalie Carroll for his PAP status.  I will f/u once I have a response from Natalie Carroll. Thanks Selena Batten

## 2023-01-26 NOTE — Telephone Encounter (Signed)
Any updates, Ms.Kim?

## 2023-01-27 NOTE — Telephone Encounter (Signed)
F/U:  Natalie Carroll has informed me that patient has declined enrollment for assistance and does not want to move forward with Entyvio treatments due to weight loss issues from past experiences with infusions. Please advise??    Please contact patient for follow up.  Thanks Selena Batten

## 2023-01-27 NOTE — Telephone Encounter (Signed)
Is the first time hearing of this.  Dottie can you check with this patient to determine if she wants to be treated for her Crohn's disease or not?  Thompson Grayer is extremely safe and would not cause weight loss, I am not sure what she is referring to.  I had a lengthy discussion with her about options at her last clinic visit.  Otherwise she can be booked for a follow-up visit with me to discuss this further.  Thank you

## 2023-01-27 NOTE — Telephone Encounter (Signed)
Thank you Dottie, appreciate your help.

## 2023-01-27 NOTE — Telephone Encounter (Signed)
Dr Adela Lank-  I have spoken to patient who tells me that she has told several people starting some time ago that she did not want to take Entyvio.  She says she has read possible side effects and they scare her. I advised that Thompson Grayer is generally one of the safer biologics to take. She says she has lost weight and hasn't gained any back yet, is scared that she will start having diarrhea again like she was previously and says that she is currently doing well without anything. I advised that we should have her come to discuss options with Dr Adela Lank in person. She is scheduled for the next availability 05/13/23. Patient verbalizes understanding.

## 2023-02-16 ENCOUNTER — Other Ambulatory Visit: Payer: Self-pay | Admitting: Pharmacy Technician

## 2023-05-13 ENCOUNTER — Ambulatory Visit (INDEPENDENT_AMBULATORY_CARE_PROVIDER_SITE_OTHER): Payer: HMO | Admitting: Gastroenterology

## 2023-05-13 ENCOUNTER — Encounter: Payer: Self-pay | Admitting: Gastroenterology

## 2023-05-13 VITALS — BP 98/60 | HR 80 | Ht 67.0 in | Wt 124.0 lb

## 2023-05-13 DIAGNOSIS — R0982 Postnasal drip: Secondary | ICD-10-CM

## 2023-05-13 DIAGNOSIS — K5000 Crohn's disease of small intestine without complications: Secondary | ICD-10-CM

## 2023-05-13 DIAGNOSIS — K219 Gastro-esophageal reflux disease without esophagitis: Secondary | ICD-10-CM | POA: Diagnosis not present

## 2023-05-13 DIAGNOSIS — R490 Dysphonia: Secondary | ICD-10-CM | POA: Diagnosis not present

## 2023-05-13 DIAGNOSIS — Z79899 Other long term (current) drug therapy: Secondary | ICD-10-CM

## 2023-05-13 MED ORDER — IPRATROPIUM BROMIDE 0.06 % NA SOLN: 2.0000 | Freq: Four times a day (QID) | NASAL | 1 refills | Status: DC | PRN

## 2023-05-13 MED ORDER — FEXOFENADINE HCL 60 MG PO TABS: 60.0000 mg | ORAL_TABLET | Freq: Every day | ORAL | 1 refills | Status: DC

## 2023-05-13 NOTE — Patient Instructions (Addendum)
You will be contacted regarding starting Entyvio.  We have sent the following medications to your pharmacy for you to pick up at your convenience: Allegra 60 mg: Take once daily Atrovent nasal spray: use 2 sprays in each nare every 6 hours as needed  Decrease omeprazole to every other day as tolerated.  Please follow up in 6 months.  Thank you for entrusting me with your care and for choosing [facility], Dr. Ileene Patrick    If your blood pressure at your visit was 140/90 or greater, please contact your primary care physician to follow up on this. ______________________________________________________  If you are age 43 or older, your body mass index should be between 23-30. Your Body mass index is 19.42 kg/m. If this is out of the aforementioned range listed, please consider follow up with your Primary Care Provider.  If you are age 48 or younger, your body mass index should be between 19-25. Your Body mass index is 19.42 kg/m. If this is out of the aformentioned range listed, please consider follow up with your Primary Care Provider.  ________________________________________________________  The [place] [facility] providers would like to encourage you to use [facility] to communicate with providers for non-urgent requests or questions.  Due to long hold times on the telephone, sending your provider a message by [facility] may be a faster and more efficient way to get a response.  Please allow 48 business hours for a response.  Please remember that this is for non-urgent requests.  _______________________________________________________  Due to recent changes in healthcare laws, you may see the results of your imaging and laboratory studies on MyChart before your provider has had a chance to review them.  We understand that in some cases there may be results that are confusing or concerning to you. Not all laboratory results come back in the same time frame and the provider may be waiting  for multiple results in order to interpret others.  Please give [place] 48 hours in order for your provider to thoroughly review all the results before contacting the office for clarification of your results.

## 2023-05-13 NOTE — Progress Notes (Signed)
HPI :  77 year old female here for a follow-up visit for her small bowel Crohn's disease.  Recall she has a suspected Crohn's disease based off work-up as below, listed in chronological order.   Initial workup done in [facility] - Dr. Bernarda Caffey CT enterography in 2011 showed focal ileitis IBD serology panel NEGATIVE   01/07/2012 - colonoscopy showed nonerosive ileitis to the distal ileum, normal colon - path not in records 01/07/2012 - EGD showed mild gastritis, normal duodenum   02/24/2012 capsule endoscopy showing focal areas of active enteritis in the distal jejunum / proximal ileum. Treated with Pentasa at the time. Also received a course of Enterocort   Colonoscopy in February 2018 with me, 2 small AVMs, the terminal ileum was noted to be mildly erythematous but without ulcers and colon was normal.  Biopsies were done to rule out microscopic colitis and these were negative.  There was some focal active inflammation on ileal biopsies, nonspecific.  Question of Crohn's was again raised.  Capsule endoscopy was suggested but she decided not to follow through with that at that time.     Labs obtained in 2020 during symptoms of diarrhea, as below: ESR 34 Fecal calprotectin 413   She was given an empiric course of budesonide previously which provided some benefit for her.  She eventually tapered off this.  We performed a follow-up MR enterography to further assess for active inflammation of her bowel and restage things   MRE 07/26/19 -  IMPRESSION: 1. Loops of distal ileum demonstrate abnormal wall thickening and mucosal enhancement along with some small areas of skip lesions, favoring active terminal ileitis/Crohn's disease. No abscess is identified. No definite involvement of the stomach, jejunum, or colon. 2. Prominence of stool in the colon as can be encountered in the setting of constipation; no current colonic air-fluid levels are identified to favor active diarrheal  process. 3. Scattered small nonenhancing fluid signal intensity lesions in the liver favoring cysts. 4. 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 5.  Aortic Atherosclerosis (ICD10-I70.0). 6. Lumbar spondylosis and degenerative disc disease.     CT abdomen / pelvis with contrast 10/11/2022 -  IMPRESSION: 1. Signs of mild inflammatory enteritis involving right lower quadrant small bowel loops with areas of mild wall thickening, mucosal enhancement and fecalized bowel contents. A few out areas of direct caliber change noted of bowel vein the affected bowel loops which may reflect underlying stricture. 2. No signs of penetrating disease, abscess or bowel obstruction. 3. Right lower quadrant ileocolic lymph nodes are prominent measuring up to 8 mm, likely reactive. 4.  Aortic Atherosclerosis (ICD10-I70.0).   Fecal calprotectin 361 on 10/09/22 C diff negative   SINCE LAST VISIT:  At the last visit in May we had discussed possible biologic therapies.  She decided to taper off budesonide, we had discussed potentially transitioning to [facility] given its favorable side effect profile.  Ultimately she did not want to proceed with that after further discussion of it.  She had discussed continuing turmeric.  She states she went to a store who recommended "Serra gold" supplements which she started taking since her last visit and stop the turmeric.  She inquires if she should take this.  She continues to have occasional abdominal pains and gas that bothers her.  She usually has normal bowels to sometimes having loose stools.  No blood in her stools.  She has not been hospitalized at all for flares of Crohn's and she has not required any budesonide since  of last seen her.  She otherwise inquires about hoarse voice that is been bothering her.  She has been on omeprazole 20 mg daily for some time.  She states her reflux is well-controlled and she denies any heartburn or  regurgitation that bothers her.  She has had an evaluation by ENT, she states they told her things looked okay from their perspective.  She does not have any cough that bothers her.  She does endorse some postnasal drip and rhinorrhea that bothers her frequently.  She wonders if this is related to her hoarse voice.  She states she tried Claritin remotely and thought it helped but she is not taking anything for that now.  For the past few months her symptoms are worse.  She inquires about long-term management of that and if it is related to reflux.  She has had a prior EGD with no evidence of Barrett's      Other workup:  EGD 07/12/19 -  - The exam of the esophagus was normal, no hiatal hernia. - A few 3 mm sessile polyps were found in the gastric body. A few were removed with cold forceps for histology for sampling, suspect benign fundic gland polyps in the setting of PPI use. - The exam of the stomach was otherwise normal. Retroflexed views of the cardia show Hill grade I. - The duodenal bulb and second portion of the duodenum were normal.   Barium swallow 07/27/19 - normal study - no reflux seen         Past Medical History:  Diagnosis Date   Allergy    Angio-edema    Cancer (HCC)    Skin cancer   Colon polyp    GERD (gastroesophageal reflux disease)    Hyperlipidemia    Hypothyroidism    Ileitis    Osteopenia    Sleep apnea    Urticaria      Past Surgical History:  Procedure Laterality Date   BREAST BIOPSY     left breast   SKIN CANCER EXCISION     THYROIDECTOMY, PARTIAL  1991   TONSILLECTOMY     TUBAL LIGATION     Family History  Problem Relation Age of Onset   Diabetes Mother    Heart disease Mother 29       Stents   Ovarian cancer Sister    Kidney disease Sister    Bladder Cancer Sister    CVA Sister    Diabetes Brother    Heart disease Brother        Valve surgery   Colon cancer Neg Hx    Esophageal cancer Neg Hx    Rectal cancer Neg Hx    Stomach  cancer Neg Hx    Allergic rhinitis Neg Hx    Asthma Neg Hx    Eczema Neg Hx    Urticaria Neg Hx    Social History   Tobacco Use   Smoking status: Former    Current packs/day: 0.00    Types: Cigarettes    Quit date: 07/28/1989    Years since quitting: 33.8   Smokeless tobacco: Never  Vaping Use   Vaping status: Never Used  Substance Use Topics   Alcohol use: No   Drug use: No   Current Outpatient Medications  Medication Sig Dispense Refill   acetaminophen (TYLENOL) 325 MG tablet Take 650 mg by mouth every 6 (six) hours as needed.     AMBULATORY NON FORMULARY MEDICATION Take 1 capsule by mouth daily.  Medication Name: SerraGold High Potency Serrapeptase     ASHWAGANDHA PO Take by mouth. Take occasionally     cholecalciferol (VITAMIN D3) 25 MCG (1000 UT) tablet Take 1,000 Units by mouth daily.     clobetasol ointment (TEMOVATE) 0.05 % as needed.     escitalopram (LEXAPRO) 10 MG tablet Take 1 tablet by mouth daily.     estradiol (ESTRACE) 0.1 MG/GM vaginal cream Several times a month     levothyroxine (SYNTHROID) 100 MCG tablet Take 100 tablets by mouth daily.     Magnesium Oxide 250 MG TABS Take by mouth daily.     Multiple Vitamins-Minerals (MULTIVITAMIN ADULTS PO) Take by mouth.     Omega-3 Fatty Acids (OMEGA 3 PO) Take by mouth.     omeprazole (PRILOSEC) 20 MG capsule 1-2 capsule 30 minutes before morning meal Orally Once a day     OVER THE COUNTER MEDICATION Vitamin E, one capsule daily.     pravastatin (PRAVACHOL) 20 MG tablet Take 20 mg by mouth daily.     vitamin B-12 (CYANOCOBALAMIN) 1000 MCG tablet Take 1,000 mcg by mouth daily.     famotidine (PEPCID) 40 MG tablet Take 1 tablet (40 mg total) by mouth daily. (Patient not taking: Reported on 12/05/2022) 30 tablet 6   TURMERIC PO Take by mouth. (Patient not taking: Reported on 05/13/2023)     No current facility-administered medications for this visit.   No Known Allergies   Review of Systems: All systems reviewed and  negative except where noted in HPI.   Lab Results  Component Value Date   WBC 3.4 (L) 10/03/2022   HGB 11.7 (L) 10/03/2022   HCT 36.0 10/03/2022   MCV 91.1 10/03/2022   PLT 190 10/03/2022    Lab Results  Component Value Date   NA 135 10/03/2022   CL 101 10/03/2022   K 4.4 10/03/2022   CO2 29 10/03/2022   BUN 17 10/03/2022   CREATININE 0.82 10/03/2022   GFRNONAA >60 10/03/2022   CALCIUM 10.0 10/03/2022   ALBUMIN 4.0 10/03/2022   GLUCOSE 86 10/03/2022    Lab Results  Component Value Date   ALT 15 10/03/2022   AST 24 10/03/2022   ALKPHOS 89 10/03/2022   BILITOT 0.6 10/03/2022     Physical Exam: BP 98/60   Pulse 80   Ht 5\' 7"  (1.702 m)   Wt 124 lb (56.2 kg)   BMI 19.42 kg/m  Constitutional: Pleasant,well-developed, female in no acute distress. Neurological: Alert and oriented to person place and time. Psychiatric: Normal mood and affect. Behavior is normal.   ASSESSMENT: 77 y.o. female here for assessment of the following  1. Crohn's disease of small intestine without complication (HCC)   2. Gastroesophageal reflux disease, unspecified whether esophagitis present   3. Long-term current use of proton pump inhibitor therapy   4. Post-nasal drip   5. Hoarseness    As above, she has had Crohn's disease for several years now.  She has persistent inflammatory changes on imaging and fecal calprotectin on multiple occasions.  She has been hesitant to pursue more aggressive therapy for this but has some baseline mild symptoms that bother her.  I do think she has overall mild Crohn's disease of the small bowel that is causing her symptoms.  We had a good conversation today about whether or not to treat this.  She must weigh the risks of untreated Crohn's disease and the natural history of that which includes ongoing symptoms of pain and  loose stools, risks for progressive worsening to include bowel obstruction, perforation, fistulizing disease and risks for hospitalization  and surgery vs. the risks and costs of therapy.  Given her baseline symptoms and active inflammation over time, she is at risk for bowel obstruction and surgery over time.  We discussed options for treatment of Crohn's disease again at length.  Her main concern is that of risks of the regimen.  In this light I think Entyvio would be a good option for her given its very safe.  After thinking about this more if it is covered by her insurance she may elect to pursue this.  I will place the orders for the infusion center to see if it is improved and she will let me know if she wants to proceed with it.  Her QuantiFERON gold is negative.  I do not specifically recommend the supplements she is taking over-the-counter, there is no evidence that will help her Crohn's disease.  If she want to take anything could try curcumin although evidence of that has only been seen with treatment of ulcerative colitis.  If she has symptoms in the interim that bother her or if she chooses not to pursue the [facility], can continue budesonide as needed.  She will call me if she needs this.  We otherwise discussed her hoarse voice, not convinced this is coming from her reflux.  We did discuss the long-term use of omeprazole and want to use the lowest daily dose needed to control symptoms.  She will try tapering to every other day dosing and see if that continues to work okay for her.  Otherwise we will treat her for postnasal drip and see if that helps.  Recommend Allegra once daily for 1 month and trial of Atrovent nasal spray as needed.  Will await her course.  She may follow-up with her primary care for further management of postnasal drip.   PLAN: - discussed options for management of her Crohn's disease - ultimately she needs to weigh the risks / benefits of natural history of untreated CD vs. Risks / cost of therapies. I think she would benefit from therapy - she will consider Entyvio if covered by insurance, will order Entyvio  after extensive discussion - budesonide as needed - will call me if needed - no role for the supplemental enzymes she is taking for her CD, do not recommend them - continue omeprazole, try tapering to every other day dosing - discussed long term risks - try allegra once daily - 60mg  for 1 month trial - trial of atrovent nasal spray  for postnasal drip - f/u 6 months if not sooner with issues  Harlin Rain, MD [facility]

## 2023-05-14 ENCOUNTER — Telehealth: Payer: Self-pay

## 2023-05-14 NOTE — Telephone Encounter (Signed)
Auth Submission: APPROVED Site of care: Site of care: [facility] Payer: [employer] Medication & CPT/J Code(s) submitted: Entyvio (Vedolizumab) [id number] Route of submission (phone, fax, portal): fax Phone # Fax # [phone] Auth type: Buy/Bill PB Units/visits requested: 300mg  x 3 doses Reference number: [id number] Approval from: 05/13/23 to 08/11/2023    Fyi, Patient Advocate: The renewal authorization in January 2025 will be for the maintenance dose of 300mg  every 8 weeks.

## 2023-05-14 NOTE — Telephone Encounter (Signed)
Great this is wonderful news. Can you please contact the patient for scheduling if she is agreeable to proceed with Entvio? thanks

## 2023-08-25 ENCOUNTER — Encounter: Payer: Self-pay | Admitting: Gastroenterology

## 2023-11-16 LAB — HM DEXA SCAN

## 2023-12-15 ENCOUNTER — Ambulatory Visit: Admitting: Family Medicine

## 2023-12-15 ENCOUNTER — Encounter: Payer: Self-pay | Admitting: Family Medicine

## 2023-12-15 ENCOUNTER — Ambulatory Visit: Payer: Self-pay | Admitting: Family Medicine

## 2023-12-15 VITALS — BP 100/64 | HR 66 | Ht 67.0 in | Wt 125.0 lb

## 2023-12-15 DIAGNOSIS — E890 Postprocedural hypothyroidism: Secondary | ICD-10-CM

## 2023-12-15 DIAGNOSIS — E039 Hypothyroidism, unspecified: Secondary | ICD-10-CM

## 2023-12-15 DIAGNOSIS — M810 Age-related osteoporosis without current pathological fracture: Secondary | ICD-10-CM | POA: Diagnosis not present

## 2023-12-15 LAB — COMPREHENSIVE METABOLIC PANEL WITH GFR
ALT: 14 U/L (ref 0–35)
AST: 18 U/L (ref 0–37)
Albumin: 4.2 g/dL (ref 3.5–5.2)
Alkaline Phosphatase: 90 U/L (ref 39–117)
BUN: 21 mg/dL (ref 6–23)
CO2: 28 meq/L (ref 19–32)
Calcium: 10.4 mg/dL (ref 8.4–10.5)
Chloride: 100 meq/L (ref 96–112)
Creatinine, Ser: 0.74 mg/dL (ref 0.40–1.20)
GFR: 78.19 mL/min (ref >60.00)
Glucose, Bld: 77 mg/dL (ref 70–99)
Potassium: 4.4 meq/L (ref 3.5–5.1)
Sodium: 136 meq/L (ref 135–145)
Total Bilirubin: 0.3 mg/dL (ref 0.2–1.2)
Total Protein: 6.9 g/dL (ref 6.0–8.3)

## 2023-12-15 LAB — TSH: TSH: 0.61 u[IU]/mL (ref 0.35–5.50)

## 2023-12-15 LAB — PHOSPHORUS: Phosphorus: 4 mg/dL (ref 2.3–4.6)

## 2023-12-15 LAB — VITAMIN D 25 HYDROXY (VIT D DEFICIENCY, FRACTURES): VITD: 49.69 ng/mL (ref 30.00–100.00)

## 2023-12-15 LAB — MAGNESIUM: Magnesium: 2.1 mg/dL (ref 1.5–2.5)

## 2023-12-15 NOTE — Patient Instructions (Addendum)
 Thank you for coming in today.   Please get labs today before you leave  OsteoStrong  Let me know

## 2023-12-15 NOTE — Progress Notes (Signed)
 Labs look okay.  Thyroid and electrolytes look just fine.  Parathyroid hormone and ionized calcium are still pending.

## 2023-12-15 NOTE — Progress Notes (Signed)
 I, Natalie Carroll, CMA acting as a scribe for Natalie Juniper, MD.  Natalie Carroll is a 78 y.o. female who presents to [facility] today for osteoporosis management.   DEXA scan (date, T-score): 11/16/23: Spine= -1.0, L-FN= -2.5, R-FN= -2.7 Prior treatment: Carroll prior treatment History of Hip, Spine, or Wrist Fx: Carroll Heart disease or stroke: Carroll Cancer: skin cancer x 1 Kidney Disease: Carroll Gastric/Peptic Ulcer: Carroll Gastric bypass surgery: Carroll Severe GERD: yes - taking Omeprazole , somewhat controlled Hx of seizures: Carroll Age at Menopause: 78 y/o Calcium intake: not taking - was advised to d/c Calcium by PCP a few years back d/t abn lab results. Eats dairy. Vitamin D intake: OTC Hormone replacement therapy: yes- estradiol occasionally, vaginal insert Smoking history: former smoker Alcohol: yes, wine occasionally Exercise: yes, walking, plans to start weight lifting Major dental work in past year: cracked veneer  Parents with hip/spine fracture: yes - mother had hip fx Height loss: none Thyroid surgery, removed half of thyroid gland    Pertinent review of systems: Carroll fevers or chills  Relevant historical information: Thyroidectomy.  Patient walks for exercise but does not do any resistance training. Crohn's disease GERD with regurgitation.  Exam:  BP 100/64   Pulse 66   Ht 5\' 7"  (1.702 m)   Wt 125 lb (56.7 kg)   SpO2 96%   BMI 19.58 kg/m  General: Well Developed, well nourished, and in Carroll acute distress.   MSK: Normal gait     Assessment and Plan: 78 y.o. female with osteoporosis with a T-score of -2.7. Plan to proceed with a metabolic workup.  Will check thyroid and parathyroid function given her partial thyroidectomy vitamin D metabolic panel phosphorus magnesium.  Recommend adding resistance training at the gym with a personal trainer.  This should be quite helpful at reducing her risk of osteoporosis progression and fracture.  We talked  about medications.  Oral bisphosphonates are not a good idea for her as she has regurgitation and is at risk for esophageal irritation.  Her best option is probably Prolia or infusion bisphosphonates.  If she wants to minimize long-term medication use infusion of bisphosphonates once yearly for 5 years is her best way to go.  We talked about the risk of both of these medications and the risk of Carroll treatment.  She is gena wait on the labs below and do some thinking and let me know which she would like.  PDMP not reviewed this encounter. Orders Placed This Encounter  Procedures   Comprehensive metabolic panel with GFR    Osteoporis    Standing Status:   Future    Number of Occurrences:   1    Expiration Date:   12/14/2024   Magnesium    Therapeutic drug monitoring    Standing Status:   Future    Number of Occurrences:   1    Expiration Date:   12/14/2024   VITAMIN D 25 Hydroxy (Vit-D Deficiency, Fractures)    Osteoporosis    Standing Status:   Future    Number of Occurrences:   1    Expiration Date:   12/14/2024   Phosphorus    Osteroporis    Standing Status:   Future    Number of Occurrences:   1    Expiration Date:   12/14/2024   PTH, intact (Carroll Ca)   TSH    Standing Status:   Future    Number of Occurrences:  1    Expiration Date:   12/14/2024   Calcium, ionized    Standing Status:   Future    Number of Occurrences:   1    Expiration Date:   12/14/2024   Carroll orders of the defined types were placed in this encounter.    Discussed warning signs or symptoms. Please see discharge instructions. Patient expresses understanding.   The above documentation has been reviewed and is accurate and complete Natalie Carroll, M.D.

## 2023-12-16 LAB — CALCIUM, IONIZED: Calcium, Ion: 5.5 mg/dL (ref 4.7–5.5)

## 2023-12-16 LAB — PARATHYROID HORMONE, INTACT (NO CA): PTH: 37 pg/mL (ref 16–77)

## 2023-12-17 NOTE — Progress Notes (Signed)
Parathyroid hormone is normal

## 2023-12-17 NOTE — Telephone Encounter (Signed)
 Forwarding to Dr. Denyse Amass to review and advise.

## 2024-01-13 ENCOUNTER — Encounter: Payer: Self-pay | Admitting: Gastroenterology

## 2024-01-13 ENCOUNTER — Ambulatory Visit: Payer: Self-pay | Admitting: Gastroenterology

## 2024-01-13 ENCOUNTER — Other Ambulatory Visit (INDEPENDENT_AMBULATORY_CARE_PROVIDER_SITE_OTHER)

## 2024-01-13 ENCOUNTER — Ambulatory Visit: Admitting: Gastroenterology

## 2024-01-13 VITALS — BP 96/58 | HR 76 | Ht 67.0 in | Wt 125.1 lb

## 2024-01-13 DIAGNOSIS — K219 Gastro-esophageal reflux disease without esophagitis: Secondary | ICD-10-CM

## 2024-01-13 DIAGNOSIS — Z79899 Other long term (current) drug therapy: Secondary | ICD-10-CM

## 2024-01-13 DIAGNOSIS — K5000 Crohn's disease of small intestine without complications: Secondary | ICD-10-CM | POA: Diagnosis not present

## 2024-01-13 DIAGNOSIS — Z5181 Encounter for therapeutic drug level monitoring: Secondary | ICD-10-CM

## 2024-01-13 LAB — CBC WITH DIFFERENTIAL/PLATELET
Basophils Absolute: 0 10*3/uL (ref 0.0–0.1)
Basophils Relative: 0.7 % (ref 0.0–3.0)
Eosinophils Absolute: 0.1 10*3/uL (ref 0.0–0.7)
Eosinophils Relative: 3.9 % (ref 0.0–5.0)
HCT: 39.1 % (ref 36.0–46.0)
Hemoglobin: 13 g/dL (ref 12.0–15.0)
Lymphocytes Relative: 22 % (ref 12.0–46.0)
Lymphs Abs: 0.8 10*3/uL (ref 0.7–4.0)
MCHC: 33.3 g/dL (ref 30.0–36.0)
MCV: 90.6 fl (ref 78.0–100.0)
Monocytes Absolute: 0.4 10*3/uL (ref 0.1–1.0)
Monocytes Relative: 9.7 % (ref 3.0–12.0)
Neutro Abs: 2.4 10*3/uL (ref 1.4–7.7)
Neutrophils Relative %: 63.7 % (ref 43.0–77.0)
Platelets: 206 10*3/uL (ref 150.0–400.0)
RBC: 4.32 Mil/uL (ref 3.87–5.11)
RDW: 13.7 % (ref 11.5–15.5)
WBC: 3.7 10*3/uL — ABNORMAL LOW (ref 4.0–10.5)

## 2024-01-13 LAB — B12 AND FOLATE PANEL
Folate: >23.2 ng/mL (ref >5.9)
Vitamin B-12: 1398 pg/mL — ABNORMAL HIGH (ref 211–911)

## 2024-01-13 LAB — IBC + FERRITIN
Ferritin: 11.2 ng/mL (ref 10.0–291.0)
Iron: 98 ug/dL (ref 42–145)
Saturation Ratios: 22.8 % (ref 20.0–50.0)
TIBC: 429.8 ug/dL (ref 250.0–450.0)
Transferrin: 307 mg/dL (ref 212.0–360.0)

## 2024-01-13 MED ORDER — FAMOTIDINE 20 MG PO TABS
20.0000 mg | ORAL_TABLET | Freq: Every day | ORAL | 3 refills | Status: AC
Start: 2024-01-13 — End: ?

## 2024-01-13 NOTE — Progress Notes (Signed)
 HPI :  78 year old female here for a follow-up visit for her small bowel Crohn's disease.   Recall she has a suspected Crohn's disease based off work-up as below, listed in chronological order.   Initial workup done in [facility] - Dr. Kari Otto CT enterography in 2011 showed focal ileitis IBD serology panel NEGATIVE   01/07/2012 - colonoscopy showed nonerosive ileitis to the distal ileum, normal colon - path not in records 01/07/2012 - EGD showed mild gastritis, normal duodenum   02/24/2012 capsule endoscopy showing focal areas of active enteritis in the distal jejunum / proximal ileum. Treated with Pentasa at the time. Also received a course of Enterocort   Colonoscopy in February 2018 with me, 2 small AVMs, the terminal ileum was noted to be mildly erythematous but without ulcers and colon was normal.  Biopsies were done to rule out microscopic colitis and these were negative.  There was some focal active inflammation on ileal biopsies, nonspecific.  Question of Crohn's was again raised.  Capsule endoscopy was suggested but she decided not to follow through with that at that time.     Labs obtained in 2020 during symptoms of diarrhea, as below: ESR 34 Fecal calprotectin 413   She was given an empiric course of budesonide  previously which provided some benefit for her.  She eventually tapered off this.  We performed a follow-up MR enterography to further assess for active inflammation of her bowel and restage things   MRE 07/26/19 -  IMPRESSION: 1. Loops of distal ileum demonstrate abnormal wall thickening and mucosal enhancement along with some small areas of skip lesions, favoring active terminal ileitis/Crohn's disease. No abscess is identified. No definite involvement of the stomach, jejunum, or colon. 2. Prominence of stool in the colon as can be encountered in the setting of constipation; no current colonic air-fluid levels are identified to favor active diarrheal  process. 3. Scattered small nonenhancing fluid signal intensity lesions in the liver favoring cysts. 4. 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 5.  Aortic Atherosclerosis (ICD10-I70.0). 6. Lumbar spondylosis and degenerative disc disease.       CT abdomen / pelvis with contrast 10/11/2022 -  IMPRESSION: 1. Signs of mild inflammatory enteritis involving right lower quadrant small bowel loops with areas of mild wall thickening, mucosal enhancement and fecalized bowel contents. A few out areas of direct caliber change noted of bowel vein the affected bowel loops which may reflect underlying stricture. 2. No signs of penetrating disease, abscess or bowel obstruction. 3. Right lower quadrant ileocolic lymph nodes are prominent measuring up to 8 mm, likely reactive. 4.  Aortic Atherosclerosis (ICD10-I70.0).   Fecal calprotectin 361 on 10/09/22 C diff negative     SINCE LAST VISIT:   Patient is here for a follow-up visit, I last saw her in November of last year.  She previously was on a course of budesonide  but had tapered off.  We discussed if he wanted to pursue biologic therapy or not.  She was having occasional abdominal pains and gas that was bothering her.  We looked into getting her on Entyvio and it was approved by her insurance, she thought about this and opted against treatment.  She has been off all Crohn's medicines since have last seen her.  Generally she has been doing pretty well without any flares of her Crohn's that she is aware of.  She currently has regular bowel movements and no abdominal pains it has been bothering her.  Recall she has never  been hospitalized or required any surgeries for her disease.  She has never been on any biologic therapies and has been hesitant to do so.  She has isolated ileal disease, no colonic involvement.  We discussed her options again.  Since of last seen her she was diagnosed with osteoporosis.  Her  vitamin D  levels are normal, she states she is not currently receiving therapy for osteoporosis.  She also has questions about long-term use of omeprazole  and risks associated with this.  She is concerned about dementia risk with omeprazole , we discussed this for a bit and her options.  She has had a few EGDs now without Barrett's, last done in 2020 She thinks omeprazole  has worked well in the past to control her reflux, she continues it every day.  She does have hoarseness in her voice for which she has seen ENT in the past as well, negative evaluation, unclear if it is due to that or some postnasal drip that she has.         Other workup:  EGD 07/12/19 -  - The exam of the esophagus was normal, no hiatal hernia. - A few 3 mm sessile polyps were found in the gastric body. A few were removed with cold forceps for histology for sampling, suspect benign fundic gland polyps in the setting of PPI use. - The exam of the stomach was otherwise normal. Retroflexed views of the cardia show Hill grade I. - The duodenal bulb and second portion of the duodenum were normal.   Barium swallow 07/27/19 - normal study - no reflux seen      Past Medical History:  Diagnosis Date   Allergy    Angio-edema    Cancer (HCC)    Skin cancer   Colon polyp    Crohn's disease (HCC)    ileitis - declines biologic therapy   GERD (gastroesophageal reflux disease)    Hyperlipidemia    Hypothyroidism    Ileitis    Osteopenia    Sleep apnea    Urticaria      Past Surgical History:  Procedure Laterality Date   BREAST BIOPSY     left breast   SKIN CANCER EXCISION     THYROIDECTOMY, PARTIAL  1991   TONSILLECTOMY     TUBAL LIGATION     Family History  Problem Relation Age of Onset   Diabetes Mother    Heart disease Mother 76       Stents   Ovarian cancer Sister    Kidney disease Sister    Bladder Cancer Sister    CVA Sister    Diabetes Brother    Heart disease Brother        Valve surgery    Colon cancer Neg Hx    Esophageal cancer Neg Hx    Rectal cancer Neg Hx    Stomach cancer Neg Hx    Allergic rhinitis Neg Hx    Asthma Neg Hx    Eczema Neg Hx    Urticaria Neg Hx    Social History   Tobacco Use   Smoking status: Former    Current packs/day: 0.00    Types: Cigarettes    Quit date: 07/28/1989    Years since quitting: 34.4   Smokeless tobacco: Never  Vaping Use   Vaping status: Never Used  Substance Use Topics   Alcohol use: Yes    Alcohol/week: 1.0 standard drink of alcohol    Types: 1 Glasses of wine per week  Comment: 1 to 2 glasses of wine per week   Drug use: No   Current Outpatient Medications  Medication Sig Dispense Refill   ALOE VERA PO Take 1 tablet by mouth daily.     b complex vitamins capsule Take 1 capsule by mouth daily.     cholecalciferol (VITAMIN D3) 25 MCG (1000 UT) tablet Take 1,000 Units by mouth daily.     clobetasol ointment (TEMOVATE) 0.05 % as needed.     escitalopram (LEXAPRO) 10 MG tablet Take 1 tablet by mouth daily.     estradiol (ESTRACE) 0.1 MG/GM vaginal cream Several times a month     famotidine  (PEPCID ) 20 MG tablet Take 1 tablet (20 mg total) by mouth daily. 90 tablet 3   levothyroxine (SYNTHROID) 100 MCG tablet Take 100 tablets by mouth daily.     Magnesium 300 MG CAPS 1 capsule with a meal Orally Once a day for 30 day(s)     Multiple Vitamins-Minerals (MULTI FOR HER 50+) CAPS Take 1 tablet by mouth daily.     omeprazole  (PRILOSEC) 20 MG capsule Take 20 mg by mouth every other day. (Patient taking differently: Take 20 mg by mouth daily.)     pravastatin (PRAVACHOL) 20 MG tablet Take 20 mg by mouth daily.     Turmeric (QC TUMERIC COMPLEX PO) Take 1 tablet by mouth daily.     valACYclovir (VALTREX) 500 MG tablet TAKE 1 TABLET BY MOUTH 2 TIMES DAILY FOR 3 DAYS. TAKE AT FIRST SIGN OF HERPES LESION.     No current facility-administered medications for this visit.   No Known Allergies   Review of Systems: All systems  reviewed and negative except where noted in HPI.   Lab Results  Component Value Date   WBC 3.4 (L) 10/03/2022   HGB 11.7 (L) 10/03/2022   HCT 36.0 10/03/2022   MCV 91.1 10/03/2022   PLT 190 10/03/2022    Lab Results  Component Value Date   NA 136 12/15/2023   CL 100 12/15/2023   K 4.4 12/15/2023   CO2 28 12/15/2023   BUN 21 12/15/2023   CREATININE 0.74 12/15/2023   GFR 78.19 12/15/2023   CALCIUM 10.4 12/15/2023   PHOS 4.0 12/15/2023   ALBUMIN 4.2 12/15/2023   GLUCOSE 77 12/15/2023    Lab Results  Component Value Date   ALT 14 12/15/2023   AST 18 12/15/2023   ALKPHOS 90 12/15/2023   BILITOT 0.3 12/15/2023     Physical Exam: BP (!) 96/58   Pulse 76   Ht 5' 7 (1.702 m)   Wt 125 lb 2 oz (56.8 kg)   SpO2 95%   BMI 19.60 kg/m  Constitutional: Pleasant,well-developed, female in no acute distress. Neurological: Alert and oriented to person place and time. Psychiatric: Normal mood and affect. Behavior is normal.   ASSESSMENT: 78 y.o. female here for assessment of the following  1. Crohn's disease of small intestine without complication (HCC)   2. Gastroesophageal reflux disease, unspecified whether esophagitis present   3. Long-term current use of proton pump inhibitor therapy    See prior notes for details of her case.  She has ileal Crohn's disease for several years, essentially untreated.  She has had intermittent abdominal pains and bowel dysfunction over the years.  Ultimately she has declined maintenance therapy for this.  We discussed natural history of Crohn disease, risk for bowel obstruction, surgery, abscess, complications etc.  Overall given duration disease, she has mild Crohn's disease, no hospitalizations or  severe flares.  The last time I saw her she was having fairly frequent symptoms so I recommended biologic therapy.  We had discussed options, she preferred Entyvio due to favorable safety profile.  This was approved by her insurance however she  ultimately declined therapy.  She had been on budesonide  in the past but has not needed it since have seen her.  Again we discussed options and reasoning for Crohn's disease.  She is feeling much better since the last time of seeing her.  Given she feels well she wants to hold off on treatment with biologic therapy, as long as her symptoms are mild and intermittent she will not want to pursue this.  If she has symptoms that bother her she would prefer to use budesonide  as needed for flares.  She will call me if she needs this.  Labs are generally up-to-date and look okay.  She has not had a CBC in a while and we will check that as well as iron studies and B12 deficiency given she is at risk for that with untreated Crohn's disease.  If she is deficient we will recommend supplementation.  We discussed her history of GERD, reviewed her last endoscopies which showed no evidence of Barrett's.  On omeprazole  daily and this is controlling her reflux, still has some hoarse voice with negative ENT eval.  Not clear if that is due to postnasal drip or related to her underlying reflux.  That being said she is concerned about risks of PPIs after discussion with her.  Counseled her main risk would be bone fracture for her given her osteoporosis which is a new diagnosis.  I reassured her that the majority of evidence does not suggest a clear link to dementia.  That being said, she wishes to come off omeprazole  possible.  I recommend she take it every other day for 2 weeks and then stop.  In its place I recommend taking Pepcid  20 mg daily.  If she feels that her reflux worsens on her regimen she will need to reconsider and go back on PPI, depends on how much her symptoms bother her but I reassured her she does not have Barrett's.   PLAN: - holding off on biologic therapy for now - she declines maintenance therapy for Crohn's disease - moving forward if she has symptoms, will do budesonide  PRN for flares - recently  asymptomatic.  She will call me if she feels she needs this in the future - lab today - CBC, TIBC / ferritin, B12 - replete if needed - counseled on chronic PPIs, she wants to come off given recent dx of osteoporosis - start pepcid  20mg  / day, will taper off omeprazole  and stop if tolerated - follow up yearly if not sooner with issues  Christi Coward, MD [facility]

## 2024-01-13 NOTE — Patient Instructions (Addendum)
 Please go to the lab in the basement of our building to have lab work done as you leave today. Hit B for basement when you get on the elevator.  When the doors open the lab is on your left.  We will call you with the results. Thank you.  Taper off your omeprazole  as discussed.  We have sent the following medications to your pharmacy for you to pick up at your convenience: Pepcid  20 mg: Take once daily  Please follow up in 1 year or sooner as needed.     Thank you for entrusting me with your care and for choosing [facility], Dr. Alvester Johnson  If your blood pressure at your visit was 140/90 or greater, please contact your primary care physician to follow up on this. ______________________________________________________  If you are age 11 or older, your body mass index should be between 23-30. Your Body mass index is 19.6 kg/m. If this is out of the aforementioned range listed, please consider follow up with your Primary Care Provider.  If you are age 63 or younger, your body mass index should be between 19-25. Your Body mass index is 19.6 kg/m. If this is out of the aformentioned range listed, please consider follow up with your Primary Care Provider.  ________________________________________________________  The [facility] providers would like to encourage you to use MYCHART to communicate with providers for non-urgent requests or questions.  Due to long hold times on the telephone, sending your provider a message by [facility] may be a faster and more efficient way to get a response.  Please allow 48 business hours for a response.  Please remember that this is for non-urgent requests.  _______________________________________________________  Due to recent changes in healthcare laws, you may see the results of your imaging and laboratory studies on MyChart before your provider has had a chance to review them.  We understand that in some cases there may be results that are  confusing or concerning to you. Not all laboratory results come back in the same time frame and the provider may be waiting for multiple results in order to interpret others.  Please give us  48 hours in order for your provider to thoroughly review all the results before contacting the office for clarification of your results.

## 2024-04-27 ENCOUNTER — Ambulatory Visit: Admitting: Dermatology

## 2024-06-10 ENCOUNTER — Ambulatory Visit: Admitting: Cardiology

## 2024-06-13 ENCOUNTER — Encounter: Admitting: Gastroenterology

## 2024-07-13 ENCOUNTER — Telehealth: Payer: Self-pay | Admitting: Diagnostic Neuroimaging

## 2024-07-13 NOTE — Telephone Encounter (Signed)
 Received sleep referral for pt from [facility] MD for snoring,and RLS. Placed in sleep referrals box

## 2024-08-04 ENCOUNTER — Ambulatory Visit: Admitting: Cardiology

## 2024-08-17 ENCOUNTER — Ambulatory Visit: Admitting: Cardiology

## 2024-08-19 ENCOUNTER — Ambulatory Visit: Payer: Self-pay | Admitting: Nurse Practitioner

## 2024-08-19 ENCOUNTER — Other Ambulatory Visit

## 2024-08-19 ENCOUNTER — Ambulatory Visit: Admitting: Nurse Practitioner

## 2024-08-19 ENCOUNTER — Encounter: Payer: Self-pay | Admitting: Nurse Practitioner

## 2024-08-19 DIAGNOSIS — K5000 Crohn's disease of small intestine without complications: Secondary | ICD-10-CM

## 2024-08-19 DIAGNOSIS — R159 Full incontinence of feces: Secondary | ICD-10-CM

## 2024-08-19 DIAGNOSIS — K219 Gastro-esophageal reflux disease without esophagitis: Secondary | ICD-10-CM | POA: Diagnosis not present

## 2024-08-19 LAB — COMPREHENSIVE METABOLIC PANEL WITH GFR
ALT: 10 U/L (ref 3–35)
AST: 15 U/L (ref 5–37)
Albumin: 4.3 g/dL (ref 3.5–5.2)
Alkaline Phosphatase: 70 U/L (ref 39–117)
BUN: 18 mg/dL (ref 6–23)
CO2: 33 meq/L — ABNORMAL HIGH (ref 19–32)
Calcium: 10.3 mg/dL (ref 8.4–10.5)
Chloride: 101 meq/L (ref 96–112)
Creatinine, Ser: 0.75 mg/dL (ref 0.40–1.20)
GFR: 76.58 mL/min
Glucose, Bld: 64 mg/dL — ABNORMAL LOW (ref 70–99)
Potassium: 4 meq/L (ref 3.5–5.1)
Sodium: 139 meq/L (ref 135–145)
Total Bilirubin: 0.3 mg/dL (ref 0.2–1.2)
Total Protein: 7 g/dL (ref 6.0–8.3)

## 2024-08-19 LAB — C-REACTIVE PROTEIN: CRP: <0.5 mg/dL — ABNORMAL LOW (ref 1.0–20.0)

## 2024-08-19 LAB — CBC
HCT: 37.9 % (ref 36.0–46.0)
Hemoglobin: 12.7 g/dL (ref 12.0–15.0)
MCHC: 33.5 g/dL (ref 30.0–36.0)
MCV: 92.3 fl (ref 78.0–100.0)
Platelets: 186 K/uL (ref 150.0–400.0)
RBC: 4.11 Mil/uL (ref 3.87–5.11)
RDW: 13.2 % (ref 11.5–15.5)
WBC: 4.5 K/uL (ref 4.0–10.5)

## 2024-08-19 LAB — SEDIMENTATION RATE: Sed Rate: 10 mm/h (ref 0–30)

## 2024-08-19 MED ORDER — OMEPRAZOLE 20 MG PO CPDR: 20.0000 mg | DELAYED_RELEASE_CAPSULE | Freq: Every day | ORAL | 3 refills | Status: AC

## 2024-08-19 NOTE — Patient Instructions (Addendum)
 _______________________________________________________  If your blood pressure at your visit was 140/90 or greater, please contact your primary care physician to follow up on this.  _______________________________________________________  If you are age 78 or older, your body mass index should be between 23-30. Your Body mass index is 19.92 kg/m. If this is out of the aforementioned range listed, please consider follow up with your Primary Care Provider.  If you are age 47 or younger, your body mass index should be between 19-25. Your Body mass index is 19.92 kg/m. If this is out of the aformentioned range listed, please consider follow up with your Primary Care Provider.   ________________________________________________________  The [facility] providers would like to encourage you to use MYCHART to communicate with providers for non-urgent requests or questions.  Due to long hold times on the telephone, sending your provider a message by [facility] may be a faster and more efficient way to get a response.  Please allow 48 business hours for a response.  Please remember that this is for non-urgent requests.  _______________________________________________________  [facility] is using a team-based approach to care.  Your team is made up of your doctor and two to three APPS. Our APPS (Nurse Practitioners and Physician Assistants) work with your physician to ensure care continuity for you. They are fully qualified to address your health concerns and develop a treatment plan. They communicate directly with your gastroenterologist to care for you. Seeing the Advanced Practice Practitioners on your physician's team can help you by facilitating care more promptly, often allowing for earlier appointments, access to diagnostic testing, procedures, and other specialty referrals.   Your provider has requested that you go to the basement level for lab work before leaving today. Press B on the  elevator. The lab is located at the first door on the left as you exit the elevator.  We have sent the following medications to your pharmacy for you to pick up at your convenience: CHANGE:  omeprazole  20mg  one capsule take 30 minutes prior to dinner meal each day  Please purchase the following medications over the counter and take as directed:  Peptobimol 1 to 2 tablets as needed for fecal incontinence  Follow up with ENT regarding right throat pain.  Due to recent changes in healthcare laws, you may see the results of your imaging and laboratory studies on MyChart before your provider has had a chance to review them.  We understand that in some cases there may be results that are confusing or concerning to you. Not all laboratory results come back in the same time frame and the provider may be waiting for multiple results in order to interpret others.  Please give us  48 hours in order for your provider to thoroughly review all the results before contacting the office for clarification of your results.   Thank you for entrusting me with your care and choosing [facility].  Elida Shawl, NP

## 2024-08-19 NOTE — Progress Notes (Signed)
 Agree with assessment and plan as outlined.

## 2024-08-19 NOTE — Progress Notes (Signed)
 "    08/19/2024 Natalie Carroll [id number] [birth year]  Primary Gastroenterologist: Dr. Leigh   Chief Complaint: Crohn's follow-up  History of Present Illness: Natalie Klatt. Derner Alvenia) is a 79 year old female with a past medical history of hyperlipidemia, hypothyroidism, sleep apnea, osteoporosis, GERD and small bowel Crohn's disease.   Discussed the use of AI scribe software for clinical note transcription with the patient, who gave verbal consent to proceed.  History of Present Illness Natalie Carroll is a 79 year old female with Crohn's disease and gastroesophageal reflux.   She was last seen in office by Dr. Leigh 01/13/2024 for Crohn's follow-up.  At that time, she endorsed feeling pretty well without any Crohn's flare symptoms.  Not on Crohn's treatment.  Previously declined Entyvio.  She was concerned regarding long-term risks of PPI therapy and she elected to wean off omeprazole  20 mg once daily and transition to famotidine .  She presents today for further GI follow-up.  She is accompanied by her daughter. Longstanding acid reflux has been managed with omeprazole  for many years. After switching to famotidine  due to concerns about memory loss, she developed severe acid reflux with frequent belching, chest discomfort, and stomach upset. Significant diarrhea lasting for hours occurred during this period, described as making her really sick. Resuming omeprazole  two weeks ago led to improvement in reflux and diarrhea, though occasional fecal leakage persists, especially when away from home. She describes this as just always a little bit of stool matter, causing anxiety about being in public. Bloating frequently occurs after eating, and she sometimes has days without a bowel movement. No bloody stools reported.  Current medications include omeprazole  once daily in the evening, vitamin D , and vitamin E. She follows dietary recommendations to avoid foods that may worsen  reflux. She is not overweight.  An upper endoscopy in December 2020 revealed a normal esophagus, hiatal hernia, and gastric polyps. A barium swallow study performed within a week of the endoscopy was normal. Vitamin D  level in May was 49. Kidney function has been monitored annually and remains normal.  Intermittent right-sided throat/neck pain occurs at least twice weekly for over a year, unrelated to eating. No dysphagia or hesitation with swallowing. She last saw her ENT 1 year ago and at that time she did not address having a right sided throat pain.       Latest Ref Rng & Units 01/13/2024   10:07 AM 10/03/2022   12:10 PM 04/19/2019    2:33 PM  CBC  WBC 4.0 - 10.5 K/uL 3.7  3.4  4.7   Hemoglobin 12.0 - 15.0 g/dL 86.9  88.2  87.2   Hematocrit 36.0 - 46.0 % 39.1  36.0  39.8   Platelets 150.0 - 400.0 K/uL 206.0  190  200         Latest Ref Rng & Units 12/15/2023   11:39 AM 10/03/2022   12:10 PM 09/02/2019    2:41 PM  CMP  Glucose 70 - 99 mg/dL 77  86  92   BUN 6 - 23 mg/dL 21  17  18    Creatinine 0.40 - 1.20 mg/dL 9.25  9.17  9.15   Sodium 135 - 145 mEq/L 136  135  136   Potassium 3.5 - 5.1 mEq/L 4.4  4.4  4.3   Chloride 96 - 112 mEq/L 100  101  100   CO2 19 - 32 mEq/L 28  29  30    Calcium 8.4 - 10.5 mg/dL 89.5  89.9  10.3   Total Protein 6.0 - 8.3 g/dL 6.9  7.2  7.0   Total Bilirubin 0.2 - 1.2 mg/dL 0.3  0.6  0.2   Alkaline Phos 39 - 117 U/L 90  89  95   AST 0 - 37 U/L 18  24  17    ALT 0 - 35 U/L 14  15  13    Vitamin D  level 49.69 on 12/15/2023 Iron 98, ferritin 11.2 on 01/13/2024  GI PROCEDURES:  EGD 07/12/19 -  - The exam of the esophagus was normal, no hiatal hernia. - A few 3 mm sessile polyps were found in the gastric body. A few were removed with cold forceps for histology for sampling, suspect benign fundic gland polyps in the setting of PPI use. - The exam of the stomach was otherwise normal. Retroflexed views of the cardia show Hill grade I. - The duodenal bulb and  second portion of the duodenum were normal.   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 Treated with Pentasa at the time. Also received a course of Enterocort   09/16/2016 colonoscopy by Dr. Leigh, 2 small AVMs, the terminal ileum was noted to be mildly erythematous but without ulcers and colon was normal.  Biopsies were done to rule out microscopic colitis and these were negative.  There was some focal active inflammation on ileal biopsies, nonspecific.  Question of Crohn's was again raised.  Capsule endoscopy was suggested but she decided not to follow through with that at that time.     Medications Ordered Prior to Encounter[1] Allergies[2]  Current Medications, Allergies, Past Medical History, Past Surgical History, Family History and Social History were reviewed in [employer] record.  Review of Systems:   Constitutional: Negative for fever, sweats, chills or weight loss.  Respiratory: Negative for shortness of breath.   Cardiovascular: Negative for chest pain, palpitations and leg swelling.  Gastrointestinal: See HPI.  Musculoskeletal: Negative for back pain or muscle aches.  Neurological: Negative for dizziness, headaches or paresthesias.   Physical Exam: BP 106/80   Pulse 63   Ht 5' 8 (1.727 m)   Wt 131 lb (59.4 kg)   SpO2 98%   BMI 19.92 kg/m   General: 79 year old female in no acute distress. Head: Normocephalic and atraumatic. Eyes: No scleral icterus. Conjunctiva pink . Ears: Normal auditory acuity. Mouth: Dentition intact. No ulcers or lesions.  Neck: No submandibular, tonsillar or cervical lymphadenopathy. Lungs: Clear throughout to auscultation. Heart:  Regular rate and rhythm, no murmur. Abdomen: Soft, nontender and nondistended. No masses or hepatomegaly. Normal bowel sounds x 4 quadrants.  Rectal: Noninflamed circumferential external hemorrhoids.  Soft brown stool filled the rectal vault.  Diminished anal sphincter tone.  Nat CMA present at time of exam. Musculoskeletal: Symmetrical with no gross deformities. Extremities: No edema. Neurological: Alert oriented x 4. No focal deficits.  Psychological: Alert and cooperative. Normal mood and affect  Assessment and Recommendations:  79 year old female with small bowel Crohn's disease.  Not on treatment.  Previously treated with Budesonide .  Patient declined Entyvio or other biologic therapy.  Her most recent colonoscopy 08/2016 showed 2 small AVMs, the TI was mildly erythematous without ulcers and the colon was normal.  Biopsies  were negative for microscopic colitis and there was some focal active inflammation on ileal biopsies which were nonspecific.  Patient declined small bowel capsule endoscopy. - CBC, CMP, CRP and sed rate - Fecal calprotectin level - Imodium one half tab daily or Pepto-Bismol 1-2 tabs as needed as needed for fecal incontinence - Diet as tolerated  Fecal incontinence - Imodium one half tab daily or Pepto-Bismol 1-2 tabs as needed as needed for fecal incontinence - Labs as ordered above - Consider future rectal physical therapy, patient does not wish to pursue at this time  GERD, patient recently weaned off omeprazole  and transition to famotidine  with recurrence of reflux symptoms.  She restarted omeprazole  and her reflux is better controlled.  However, she endorses having right sided throat/neck pain which occurs randomly and unrelated to eating.  EGD 06/2019 showed a normal esophagus and fundic gland gastric polyps.  Barium swallow study 06/2019 was normal. - Patient to follow-up with prior established ENT, recommend  - Continue omeprazole  20 mg once daily     [1]   Current Outpatient Medications on File Prior to Visit  Medication Sig Dispense Refill   ALOE VERA PO Take 1 tablet by mouth daily.     b complex vitamins capsule Take 1 capsule by mouth daily.     cholecalciferol (VITAMIN D3) 25 MCG (1000 UT) tablet Take 1,000 Units by mouth daily.     clobetasol ointment (TEMOVATE) 0.05 % as needed.     escitalopram (LEXAPRO) 10 MG tablet Take 1 tablet by mouth daily.     estradiol (ESTRACE) 0.1 MG/GM vaginal cream Several times a month     levothyroxine (SYNTHROID) 100 MCG tablet Take 100 tablets by mouth daily.     Magnesium 300 MG CAPS 1 capsule with a meal Orally Once a day for 30 day(s)     Multiple Vitamins-Minerals (MULTI FOR HER 50+) CAPS Take 1 tablet by mouth daily.     pravastatin (PRAVACHOL) 20 MG tablet Take 20 mg by mouth daily.     Turmeric (QC TUMERIC COMPLEX PO) Take 1 tablet by mouth daily.     valACYclovir (VALTREX) 500 MG tablet TAKE 1 TABLET BY MOUTH 2 TIMES DAILY FOR 3 DAYS. TAKE AT FIRST SIGN OF HERPES LESION.     famotidine  (PEPCID ) 20 MG tablet Take 1 tablet (20 mg total) by mouth daily. (Patient not taking: Reported on 08/19/2024) 90 tablet 3   No current facility-administered medications on file prior to visit.  [2] No Known Allergies  "

## 2024-08-24 ENCOUNTER — Ambulatory Visit: Admitting: Cardiology

## 2024-08-29 ENCOUNTER — Encounter: Payer: Self-pay | Admitting: Cardiology

## 2024-08-29 DIAGNOSIS — R002 Palpitations: Secondary | ICD-10-CM | POA: Insufficient documentation

## 2024-08-31 ENCOUNTER — Ambulatory Visit: Admitting: Cardiology

## 2024-08-31 DIAGNOSIS — R002 Palpitations: Secondary | ICD-10-CM

## 2024-08-31 DIAGNOSIS — R072 Precordial pain: Secondary | ICD-10-CM

## 2024-09-15 ENCOUNTER — Ambulatory Visit: Admitting: Cardiology
